# Patient Record
Sex: Female | Born: 1995 | Race: Black or African American | Hispanic: No | Marital: Single | Smoking: Former smoker
Health system: Southern US, Community
[De-identification: ages and names within clinical notes are randomized; demographics above are authoritative.]

## PROBLEM LIST (undated history)

## (undated) DIAGNOSIS — A5900 Urogenital trichomoniasis, unspecified: Secondary | ICD-10-CM

## (undated) DIAGNOSIS — A549 Gonococcal infection, unspecified: Secondary | ICD-10-CM

## (undated) DIAGNOSIS — A749 Chlamydial infection, unspecified: Secondary | ICD-10-CM

## (undated) DIAGNOSIS — L309 Dermatitis, unspecified: Secondary | ICD-10-CM

## (undated) HISTORY — DX: Urogenital trichomoniasis, unspecified: A59.00

## (undated) HISTORY — DX: Gonococcal infection, unspecified: A54.9

## (undated) HISTORY — DX: Chlamydial infection, unspecified: A74.9

## (undated) HISTORY — PX: NO PAST SURGERIES: SHX2092

## (undated) HISTORY — DX: Dermatitis, unspecified: L30.9

---

## 2017-07-17 DIAGNOSIS — O4700 False labor before 37 completed weeks of gestation, unspecified trimester: Secondary | ICD-10-CM | POA: Insufficient documentation

## 2017-08-06 DIAGNOSIS — O9A219 Injury, poisoning and certain other consequences of external causes complicating pregnancy, unspecified trimester: Secondary | ICD-10-CM | POA: Insufficient documentation

## 2018-12-17 NOTE — L&D Delivery Note (Addendum)
OB/[facility]N Faculty Practice Delivery Note  Susan Rivera is a 23 y.o. G2P1001 s/p SVD at [redacted]w[redacted]d. She was admitted for IOL due to postdates.   ROM: SROM 11h 81m with clear fluid GBS Status:  Negative/-- (11/17 1214) Maximum Maternal Temperature: 98.3   Labor Progress: . Patient arrived at 3 cm dilation and was induced with Cytotec then pitocin.   Delivery Date/Time: 12/04/2019 at 2100 Delivery: Entered room and patient was pushing effectively. Head delivered in ROA position. No nuchal cord present. Shoulder and body delivered in usual fashion. Infant with spontaneous cry, placed on mother's abdomen, dried and stimulated. Cord clamped x 2 after 1-minute delay, and cut by grandmother of the baby. Cord blood drawn. Placenta delivered spontaneously with gentle cord traction. Fundus firm with massage and Pitocin. Labia, perineum, vagina, and cervix inspected with no laceration found.   Placenta: spontaneous, intact, 3 vessel cord Complications:  none Lacerations: none EBL: 50cc Analgesia: epidural   Infant: APGAR (1 MIN): 8   APGAR (5 MINS): 9    Weight: pending 1 hr Skin to skin  Demetrius Revel, MD PGY-3  Midwife attestation: I was gloved and present for delivery in its entirety and I agree with the above resident's note.  Lajean Manes, CNM 9:32 PM

## 2019-04-27 DIAGNOSIS — O98211 Gonorrhea complicating pregnancy, first trimester: Secondary | ICD-10-CM | POA: Insufficient documentation

## 2019-07-07 LAB — OB RESULTS CONSOLE GC/CHLAMYDIA
Chlamydia: NEGATIVE
Gonorrhea: NEGATIVE

## 2019-10-27 ENCOUNTER — Telehealth: Payer: Self-pay | Admitting: General Practice

## 2019-10-27 NOTE — Telephone Encounter (Signed)
Patient called and left message on nurse voicemail line stating she thinks she might be in early labor. She states she is seeing a pale yellow watery discharge and her braxton hicks contractions have become more intense and she isn't sure what to do.  Called patient and asked where she has been receiving her prenatal care and she states she hasn't been seen yet but is around 35 weeks. Patient reports new OB appt on 11/17. Advised patient she go to [facility] for evaluation and provided directions. Patient verbalized understanding & had no questions.

## 2019-10-28 ENCOUNTER — Encounter (HOSPITAL_COMMUNITY): Payer: Self-pay | Admitting: *Deleted

## 2019-10-28 ENCOUNTER — Other Ambulatory Visit: Payer: Self-pay

## 2019-10-28 ENCOUNTER — Inpatient Hospital Stay (HOSPITAL_COMMUNITY)
Admission: AD | Admit: 2019-10-28 | Discharge: 2019-10-28 | Disposition: A | Discharge status: Home / Self Care | Payer: BC Managed Care – PPO | Attending: Obstetrics and Gynecology | Admitting: Obstetrics and Gynecology

## 2019-10-28 DIAGNOSIS — Z87891 Personal history of nicotine dependence: Secondary | ICD-10-CM | POA: Diagnosis not present

## 2019-10-28 DIAGNOSIS — O26893 Other specified pregnancy related conditions, third trimester: Secondary | ICD-10-CM | POA: Diagnosis not present

## 2019-10-28 DIAGNOSIS — Z3689 Encounter for other specified antenatal screening: Secondary | ICD-10-CM

## 2019-10-28 DIAGNOSIS — Z0371 Encounter for suspected problem with amniotic cavity and membrane ruled out: Secondary | ICD-10-CM

## 2019-10-28 DIAGNOSIS — Z3A35 35 weeks gestation of pregnancy: Secondary | ICD-10-CM | POA: Diagnosis not present

## 2019-10-28 LAB — WET PREP, GENITAL
Clue Cells Wet Prep HPF POC: NONE SEEN
Sperm: NONE SEEN
Trich, Wet Prep: NONE SEEN
Yeast Wet Prep HPF POC: NONE SEEN

## 2019-10-28 LAB — URINALYSIS, ROUTINE W REFLEX MICROSCOPIC
Bilirubin Urine: NEGATIVE
Glucose, UA: NEGATIVE mg/dL
Hgb urine dipstick: NEGATIVE
Ketones, ur: NEGATIVE mg/dL
Leukocytes,Ua: NEGATIVE
Nitrite: NEGATIVE
Protein, ur: NEGATIVE mg/dL
Specific Gravity, Urine: 1.015 (ref 1.005–1.030)
pH: 7 (ref 5.0–8.0)

## 2019-10-28 LAB — AMNISURE RUPTURE OF MEMBRANE (ROM) NOT AT ARMC: Amnisure ROM: NEGATIVE

## 2019-10-28 LAB — POCT FERN TEST: POCT Fern Test: NEGATIVE

## 2019-10-28 NOTE — Discharge Instructions (Signed)
Safe Medications in Pregnancy    Acne: Benzoyl Peroxide Salicylic Acid  Backache/Headache: Tylenol: 2 regular strength every 4 hours OR              2 Extra strength every 6 hours  Colds/Coughs/Allergies: Benadryl (alcohol free) 25 mg every 6 hours as needed Breath right strips Claritin Cepacol throat lozenges Chloraseptic throat spray Cold-Eeze- up to three times per day Cough drops, alcohol free Flonase (by prescription only) Guaifenesin Mucinex Robitussin DM (plain only, alcohol free) Saline nasal spray/drops Sudafed (pseudoephedrine) & Actifed ** use only after [redacted] weeks gestation and if you do not have high blood pressure Tylenol Vicks Vaporub Zinc lozenges Zyrtec   Constipation: Colace Ducolax suppositories Fleet enema Glycerin suppositories Metamucil Milk of magnesia Miralax Senokot Smooth move tea  Diarrhea: Kaopectate Imodium A-D  *NO pepto Bismol  Hemorrhoids: Anusol Anusol HC Preparation H Tucks  Indigestion: Tums Maalox Mylanta Zantac  Pepcid  Insomnia: Benadryl (alcohol free)  every 6 hours as needed Tylenol PM Unisom, no Gelcaps  Leg Cramps: Tums MagGel  Nausea/Vomiting:  Bonine Dramamine Emetrol Ginger extract Sea bands Meclizine  Nausea medication to take during pregnancy:  Unisom (doxylamine succinate 25 mg tablets) Take one tablet daily at bedtime. If symptoms are not adequately controlled, the dose can be increased to a maximum recommended dose of two tablets daily (1/2 tablet in the morning, 1/2 tablet mid-afternoon and one at bedtime). Vitamin B6  tablets. Take one tablet twice a day (up to 200 mg per day).  Skin Rashes: Aveeno products Benadryl cream or  every 6 hours as needed Calamine Lotion 1% cortisone cream  Yeast infection: Gyne-lotrimin 7 Monistat 7   **If taking multiple medications, please check labels to avoid duplicating the same active ingredients **take  medication as directed on the label ** Do not exceed 4000 mg of tylenol in 24 hours **Do not take medications that contain aspirin or ibuprofen    Third Trimester of Pregnancy The third trimester is from week 28 through week 40 (months 7 through 9). The third trimester is a time when the unborn baby (fetus) is growing rapidly. At the end of the ninth month, the fetus is about 20 inches in length and weighs 6-10 pounds. Body changes during your third trimester Your body will continue to go through many changes during pregnancy. The changes vary from woman to woman. During the third trimester:  Your weight will continue to increase. You can expect to gain 25-35 pounds (11-16 kg) by the end of the pregnancy.  You may begin to get stretch marks on your hips, abdomen, and breasts.  You may urinate more often because the fetus is moving lower into your pelvis and pressing on your bladder.  You may develop or continue to have heartburn. This is caused by increased hormones that slow down muscles in the digestive tract.  You may develop or continue to have constipation because increased hormones slow digestion and cause the muscles that push waste through your intestines to relax.  You may develop hemorrhoids. These are swollen veins (varicose veins) in the rectum that can itch or be painful.  You may develop swollen, bulging veins (varicose veins) in your legs.  You may have increased body aches in the pelvis, back, or thighs. This is due to weight gain and increased hormones that are relaxing your joints.  You may have changes in your hair. These can include thickening of your hair, rapid growth, and changes in texture. Some women also have  hair loss during or after pregnancy, or hair that feels dry or thin. Your hair will most likely return to normal after your baby is born.  Your breasts will continue to grow and they will continue to become tender. A yellow fluid (colostrum) may leak from  your breasts. This is the first milk you are producing for your baby.  Your belly button may stick out.  You may notice more swelling in your hands, face, or ankles.  You may have increased tingling or numbness in your hands, arms, and legs. The skin on your belly may also feel numb.  You may feel short of breath because of your expanding uterus.  You may have more problems sleeping. This can be caused by the size of your belly, increased need to urinate, and an increase in your body's metabolism.  You may notice the fetus "dropping," or moving lower in your abdomen (lightening).  You may have increased vaginal discharge.  You may notice your joints feel loose and you may have pain around your pelvic bone. What to expect at prenatal visits You will have prenatal exams every 2 weeks until week 36. Then you will have weekly prenatal exams. During a routine prenatal visit:  You will be weighed to make sure you and the baby are growing normally.  Your blood pressure will be taken.  Your abdomen will be measured to track your baby's growth.  The fetal heartbeat will be listened to.  Any test results from the previous visit will be discussed.  You may have a cervical check near your due date to see if your cervix has softened or thinned (effaced).  You will be tested for Group B streptococcus. This happens between 35 and 37 weeks. Your health care provider may ask you:  What your birth plan is.  How you are feeling.  If you are feeling the baby move.  If you have had any abnormal symptoms, such as leaking fluid, bleeding, severe headaches, or abdominal cramping.  If you are using any tobacco products, including cigarettes, chewing tobacco, and electronic cigarettes.  If you have any questions. Other tests or screenings that may be performed during your third trimester include:  Blood tests that check for low iron levels (anemia).  Fetal testing to check the health,  activity level, and growth of the fetus. Testing is done if you have certain medical conditions or if there are problems during the pregnancy.  Nonstress test (NST). This test checks the health of your baby to make sure there are no signs of problems, such as the baby not getting enough oxygen. During this test, a belt is placed around your belly. The baby is made to move, and its heart rate is monitored during movement. What is false labor? False labor is a condition in which you feel small, irregular tightenings of the muscles in the womb (contractions) that usually go away with rest, changing position, or drinking water. These are called Braxton Hicks contractions. Contractions may last for hours, days, or even weeks before true labor sets in. If contractions come at regular intervals, become more frequent, increase in intensity, or become painful, you should see your health care provider. What are the signs of labor?  Abdominal cramps.  Regular contractions that start at 10 minutes apart and become stronger and more frequent with time.  Contractions that start on the top of the uterus and spread down to the lower abdomen and back.  Increased pelvic pressure and dull back  pain.  A watery or bloody mucus discharge that comes from the vagina.  Leaking of amniotic fluid. This is also known as your "water breaking." It could be a slow trickle or a gush. Let your health care provider know if it has a color or strange odor. If you have any of these signs, call your health care provider right away, even if it is before your due date. Follow these instructions at home: Medicines  Follow your health care provider's instructions regarding medicine use. Specific medicines may be either safe or unsafe to take during pregnancy.  Take a prenatal vitamin that contains at least 600 micrograms (mcg) of folic acid.  If you develop constipation, try taking a stool softener if your health care provider  approves. Eating and drinking   Eat a balanced diet that includes fresh fruits and vegetables, whole grains, good sources of protein such as meat, eggs, or tofu, and low-fat dairy. Your health care provider will help you determine the amount of weight gain that is right for you.  Avoid raw meat and uncooked cheese. These carry germs that can cause birth defects in the baby.  If you have low calcium intake from food, talk to your health care provider about whether you should take a daily calcium supplement.  Eat four or five small meals rather than three large meals a day.  Limit foods that are high in fat and processed sugars, such as fried and sweet foods.  To prevent constipation: ? Drink enough fluid to keep your urine clear or pale yellow. ? Eat foods that are high in fiber, such as fresh fruits and vegetables, whole grains, and beans. Activity  Exercise only as directed by your health care provider. Most women can continue their usual exercise routine during pregnancy. Try to exercise for 30 minutes at least 5 days a week. Stop exercising if you experience uterine contractions.  Avoid heavy lifting.  Do not exercise in extreme heat or humidity, or at high altitudes.  Wear low-heel, comfortable shoes.  Practice good posture.  You may continue to have sex unless your health care provider tells you otherwise. Relieving pain and discomfort  Take frequent breaks and rest with your legs elevated if you have leg cramps or low back pain.  Take warm sitz baths to soothe any pain or discomfort caused by hemorrhoids. Use hemorrhoid cream if your health care provider approves.  Wear a good support bra to prevent discomfort from breast tenderness.  If you develop varicose veins: ? Wear support pantyhose or compression stockings as told by your healthcare provider. ? Elevate your feet for 15 minutes, 3-4 times a day. Prenatal care  Write down your questions. Take them to your  prenatal visits.  Keep all your prenatal visits as told by your health care provider. This is important. Safety  Wear your seat belt at all times when driving.  Make a list of emergency phone numbers, including numbers for family, friends, the hospital, and police and fire departments. General instructions  Avoid cat litter boxes and soil used by cats. These carry germs that can cause birth defects in the baby. If you have a cat, ask someone to clean the litter box for you.  Do not travel far distances unless it is absolutely necessary and only with the approval of your health care provider.  Do not use hot tubs, steam rooms, or saunas.  Do not drink alcohol.  Do not use any products that contain nicotine or tobacco,  such as cigarettes and e-cigarettes. If you need help quitting, ask your health care provider.  Do not use any medicinal herbs or unprescribed drugs. These chemicals affect the formation and growth of the baby.  Do not douche or use tampons or scented sanitary pads.  Do not cross your legs for long periods of time.  To prepare for the arrival of your baby: ? Take prenatal classes to understand, practice, and ask questions about labor and delivery. ? Make a trial run to the hospital. ? Visit the hospital and tour the maternity area. ? Arrange for maternity or paternity leave through employers. ? Arrange for family and friends to take care of pets while you are in the hospital. ? Purchase a rear-facing car seat and make sure you know how to install it in your car. ? Pack your hospital bag. ? Prepare the babys nursery. Make sure to remove all pillows and stuffed animals from the baby's crib to prevent suffocation.  Visit your dentist if you have not gone during your pregnancy. Use a soft toothbrush to brush your teeth and be gentle when you floss. Contact a health care provider if:  You are unsure if you are in labor or if your water has broken.  You become  dizzy.  You have mild pelvic cramps, pelvic pressure, or nagging pain in your abdominal area.  You have lower back pain.  You have persistent nausea, vomiting, or diarrhea.  You have an unusual or bad smelling vaginal discharge.  You have pain when you urinate. Get help right away if:  Your water breaks before 37 weeks.  You have regular contractions less than 5 minutes apart before 37 weeks.  You have a fever.  You are leaking fluid from your vagina.  You have spotting or bleeding from your vagina.  You have severe abdominal pain or cramping.  You have rapid weight loss or weight gain.  You have shortness of breath with chest pain.  You notice sudden or extreme swelling of your face, hands, ankles, feet, or legs.  Your baby makes fewer than 10 movements in 2 hours.  You have severe headaches that do not go away when you take medicine.  You have vision changes. Summary  The third trimester is from week 28 through week 40, months 7 through 9. The third trimester is a time when the unborn baby (fetus) is growing rapidly.  During the third trimester, your discomfort may increase as you and your baby continue to gain weight. You may have abdominal, leg, and back pain, sleeping problems, and an increased need to urinate.  During the third trimester your breasts will keep growing and they will continue to become tender. A yellow fluid (colostrum) may leak from your breasts. This is the first milk you are producing for your baby.  False labor is a condition in which you feel small, irregular tightenings of the muscles in the womb (contractions) that eventually go away. These are called Braxton Hicks contractions. Contractions may last for hours, days, or even weeks before true labor sets in.  Signs of labor can include: abdominal cramps; regular contractions that start at 10 minutes apart and become stronger and more frequent with time; watery or bloody mucus discharge that  comes from the vagina; increased pelvic pressure and dull back pain; and leaking of amniotic fluid. This information is not intended to replace advice given to you by your health care provider. Make sure you discuss any questions you have with  your health care provider. Document Released: 11/27/2001 Document Revised: 03/26/2019 Document Reviewed: 01/08/2017 Elsevier Patient Education  2020 [employer].    Signs and Symptoms of Labor Labor is your body's natural process of moving your baby, placenta, and umbilical cord out of your uterus. The process of labor usually starts when your baby is full-term, between 68 and 40 weeks of pregnancy. How will I know when I am close to going into labor? As your body prepares for labor and the birth of your baby, you may notice the following symptoms in the weeks and days before true labor starts:  Having a strong desire to get your home ready to receive your new baby. This is called nesting. Nesting may be a sign that labor is approaching, and it may occur several weeks before birth. Nesting may involve cleaning and organizing your home.  Passing a small amount of thick, bloody mucus out of your vagina (normal bloody show or losing your mucus plug). This may happen more than a week before labor begins, or it might occur right before labor begins as the opening of the cervix starts to widen (dilate). For some women, the entire mucus plug passes at once. For others, smaller portions of the mucus plug may gradually pass over several days.  Your baby moving (dropping) lower in your pelvis to get into position for birth (lightening). When this happens, you may feel more pressure on your bladder and pelvic bone and less pressure on your ribs. This may make it easier to breathe. It may also cause you to need to urinate more often and have problems with bowel movements.  Having "practice contractions" (Braxton Hicks contractions) that occur at irregular (unevenly  spaced) intervals that are more than 10 minutes apart. This is also called false labor. False labor contractions are common after exercise or sexual activity, and they will stop if you change position, rest, or drink fluids. These contractions are usually mild and do not get stronger over time. They may feel like: ? A backache or back pain. ? Mild cramps, similar to menstrual cramps. ? Tightening or pressure in your abdomen. Other early symptoms that labor may be starting soon include:  Nausea or loss of appetite.  Diarrhea.  Having a sudden burst of energy, or feeling very tired.  Mood changes.  Having trouble sleeping. How will I know when labor has begun? Signs that true labor has begun may include:  Having contractions that come at regular (evenly spaced) intervals and increase in intensity. This may feel like more intense tightening or pressure in your abdomen that moves to your back. ? Contractions may also feel like rhythmic pain in your upper thighs or back that comes and goes at regular intervals. ? For first-time mothers, this change in intensity of contractions often occurs at a more gradual pace. ? Women who have given birth before may notice a more rapid progression of contraction changes.  Having a feeling of pressure in the vaginal area.  Your water breaking (rupture of membranes). This is when the sac of fluid that surrounds your baby breaks. When this happens, you will notice fluid leaking from your vagina. This may be clear or blood-tinged. Labor usually starts within 24 hours of your water breaking, but it may take longer to begin. ? Some women notice this as a gush of fluid. ? Others notice that their underwear repeatedly becomes damp. Follow these instructions at home:   When labor starts, or if your water breaks,  call your health care provider or nurse care line. Based on your situation, they will determine when you should go in for an exam.  When you are in  early labor, you may be able to rest and manage symptoms at home. Some strategies to try at home include: ? Breathing and relaxation techniques. ? Taking a warm bath or shower. ? Listening to music. ? Using a heating pad on the lower back for pain. If you are directed to use heat:  Place a towel between your skin and the heat source.  Leave the heat on for 20-30 minutes.  Remove the heat if your skin turns bright red. This is especially important if you are unable to feel pain, heat, or cold. You may have a greater risk of getting burned. Get help right away if:  You have painful, regular contractions that are 5 minutes apart or less.  Labor starts before you are [redacted] weeks along in your pregnancy.  You have a fever.  You have a headache that does not go away.  You have bright red blood coming from your vagina.  You do not feel your baby moving.  You have a sudden onset of: ? Severe headache with vision problems. ? Nausea, vomiting, or diarrhea. ? Chest pain or shortness of breath. These symptoms may be an emergency. If your health care provider recommends that you go to the hospital or birth center where you plan to deliver, do not drive yourself. Have someone else drive you, or call emergency services (911 in the [place] Summary  Labor is your body's natural process of moving your baby, placenta, and umbilical cord out of your uterus.  The process of labor usually starts when your baby is full-term, between 38 and 40 weeks of pregnancy.  When labor starts, or if your water breaks, call your health care provider or nurse care line. Based on your situation, they will determine when you should go in for an exam. This information is not intended to replace advice given to you by your health care provider. Make sure you discuss any questions you have with your health care provider. Document Released: 05/10/2017 Document Revised: 09/02/2017 Document Reviewed: 05/10/2017 Elsevier  Patient Education  2020 [employer].

## 2019-10-28 NOTE — MAU Provider Note (Signed)
History     CSN: [id number]  Arrival date and time: 10/28/19 1151   First Provider Initiated Contact with Patient 10/28/19 1245      Chief Complaint  Patient presents with  . Contractions  . Rupture of Membranes   [name] is a 23 y.o. G2P1001 at [redacted]w[redacted]d who presents to MAU for PTL evaluation after she had "light yellow watery discharge" that began last week. Pt reports "intense Braxton-Hicks contractions" that have been going on for about a week, but are not present today. Pt reports the contractions are usually more present at night time. Pt reports "a lot of pelvic pressure that goes down into the vagina." Pt stating today that her mother suggested she come in to be checked to be sure everything is OK as her last pregnancy was high-risk.  Pt denies VB, new onset backache, intermittent abdominal discomfort/pain, pelvic pain, cramping. Pt denies chest pain and SOB.  Pt denies constipation, diarrhea, or urinary problems. Pt denies fever, chills, fatigue, sweating or changes in appetite. Pt denies dizziness, light-headedness, weakness.  Pt denies VB and reports good FM.  Current pregnancy problems? none this pregnancy, last pregnancy had polyhydramnios Blood Type? unknown Allergies? NKDA Current medications? PNVs Current PNC & next appt? [facility] 11/03/2019 for transfer of care, pt was getting regular PNC in [place]   OB History    Gravida  2   Para  1   Term  1   Preterm      AB      Living  1     SAB      TAB      Ectopic      Multiple      Live Births              Past Medical History:  Diagnosis Date  . Medical history non-contributory     Past Surgical History:  Procedure Laterality Date  . NO PAST SURGERIES      Family History  Problem Relation Age of Onset  . Diabetes Maternal Uncle   . Diabetes Maternal Grandfather   . Heart disease Maternal Grandfather     Social History   Tobacco Use  . Smoking status: Former Smoker    Quit  date: 02/15/2019    Years since quitting: 0.6  . Smokeless tobacco: Never Used  Substance Use Topics  . Alcohol use: Not Currently    Comment: none since + UPT  . Drug use: Never    Allergies: No Known Allergies  No medications prior to admission.    Review of Systems  Constitutional: Negative for chills, diaphoresis, fatigue and fever.  Eyes: Negative for visual disturbance.  Respiratory: Negative for shortness of breath.   Cardiovascular: Negative for chest pain.  Gastrointestinal: Negative for abdominal pain, constipation, diarrhea, nausea and vomiting.  Genitourinary: Positive for vaginal discharge. Negative for dysuria, flank pain, frequency, pelvic pain, urgency and vaginal bleeding.  Neurological: Negative for dizziness, weakness, light-headedness and headaches.   Physical Exam   Blood pressure 116/65, pulse 91, temperature 98.1 F (36.7 C), temperature source Oral, resp. rate 20, height 5\' 1"  (1.549 m), weight 67.3 kg, SpO2 100 %.  Patient Vitals for the past 24 hrs:  BP Temp Temp src Pulse Resp SpO2 Height Weight  10/28/19 1214 116/65 98.1 F (36.7 C) Oral 91 20 100 % - -  10/28/19 1205 - - - - - - 5\' 1"  (1.549 m) 67.3 kg   Physical Exam  Constitutional: She is  oriented to person, place, and time. She appears well-developed and well-nourished. No distress.  HENT:  Head: Normocephalic and atraumatic.  Respiratory: Effort normal.  GI: Soft. She exhibits no distension and no mass. There is no abdominal tenderness. There is no rebound and no guarding.  Genitourinary: There is no rash, tenderness or lesion on the right labia. There is no rash, tenderness or lesion on the left labia. Uterus is enlarged. Uterus is not tender. Cervix exhibits no motion tenderness, no discharge and no friability.    No vaginal discharge, tenderness or bleeding.  No tenderness or bleeding in the vagina.  Neurological: She is alert and oriented to person, place, and time.  Skin: Skin is warm  and dry. She is not diaphoretic.  Psychiatric: She has a normal mood and affect. Her behavior is normal. Judgment and thought content normal.   Results for orders placed or performed during the hospital encounter of 10/28/19 (from the past 24 hour(s))  Urinalysis, Routine w reflex microscopic     Status: None   Collection Time: 10/28/19 12:53 PM  Result Value Ref Range   Color, Urine YELLOW YELLOW   APPearance CLEAR CLEAR   Specific Gravity, Urine 1.015 1.005 - 1.030   pH 7.0 5.0 - 8.0   Glucose, UA NEGATIVE NEGATIVE mg/dL   Hgb urine dipstick NEGATIVE NEGATIVE   Bilirubin Urine NEGATIVE NEGATIVE   Ketones, ur NEGATIVE NEGATIVE mg/dL   Protein, ur NEGATIVE NEGATIVE mg/dL   Nitrite NEGATIVE NEGATIVE   Leukocytes,Ua NEGATIVE NEGATIVE  Wet prep, genital     Status: Abnormal   Collection Time: 10/28/19  1:10 PM  Result Value Ref Range   Yeast Wet Prep HPF POC NONE SEEN NONE SEEN   Trich, Wet Prep NONE SEEN NONE SEEN   Clue Cells Wet Prep HPF POC NONE SEEN NONE SEEN   WBC, Wet Prep HPF POC MODERATE (A) NONE SEEN   Sperm NONE SEEN   POCT fern test     Status: None   Collection Time: 10/28/19  1:34 PM  Result Value Ref Range   POCT Fern Test Negative = intact amniotic membranes   [facility]     Status: None   Collection Time: 10/28/19  1:34 PM  Result Value Ref Range   Amnisure ROM NEGATIVE    MAU Course  Procedures  MDM -r/o PPROM -no pooling on SSE, scant white discharge present -UA: WNL -Fern: negative -AmniSure: negative -WetPrep: WNL -GC/CT collected -EFM: reactive       -baseline: 125       -variability: moderate       -accels: present, 15x15       -decels: absent       -TOCO: irritability -pt discharged to home in stable condition  Orders Placed This Encounter  Procedures  . Wet prep, genital    Standing Status:   Standing    Number of Occurrences:   1  . Urinalysis, Routine w reflex microscopic    Standing Status:    Standing    Number of Occurrences:   1  . Amnisure rupture [facility] membrane (rom[facility]not at [facility]    Standing Status:   Standing    Number of Occurrences:   1  . POCT fern test    Standing Status:   Standing    Number of Occurrences:   1  . Discharge patient    Order Specific Question:   Discharge disposition    Answer:   01-Home  or Self Care [1]    Order Specific Question:   Discharge patient date    Answer:   10/28/2019   Assessment and Plan   1. Encounter for suspected premature rupture of amniotic membranes, with rupture of membranes not found   2. [redacted] weeks gestation of pregnancy   3. NST (non-stress test) reactive    Allergies as of 10/28/2019   No Known Allergies     Medication List    You have not been prescribed any medications.    -pt has OB appt @ [facility] 11/03/2019 to transfer care from [place], pt strongly advised to keep appt -discussed s/sx of labor/ROM -MAU return precautions given -pt discharged to home in stable condition  Elmyra Ricks E Nugent 10/28/2019, 2:26 PM

## 2019-10-28 NOTE — MAU Note (Signed)
Present with c/o worsening [address] and an increase in vaginal discharge, states discharge is watery.  Denies VB.  Endorses +FM. Pt reports no PNC since August secondary moved to [facility].  Received care in [facility]

## 2019-10-29 LAB — GC/CHLAMYDIA PROBE AMP (~~LOC~~) NOT AT ARMC
Chlamydia: NEGATIVE
Comment: NEGATIVE
Comment: NORMAL
Neisseria Gonorrhea: NEGATIVE

## 2019-11-02 ENCOUNTER — Encounter: Payer: Self-pay | Admitting: *Deleted

## 2019-11-02 ENCOUNTER — Telehealth: Payer: Self-pay | Admitting: Obstetrics and Gynecology

## 2019-11-02 DIAGNOSIS — A749 Chlamydial infection, unspecified: Secondary | ICD-10-CM

## 2019-11-02 DIAGNOSIS — A549 Gonococcal infection, unspecified: Secondary | ICD-10-CM | POA: Insufficient documentation

## 2019-11-02 NOTE — Telephone Encounter (Signed)
Attempted to call patient about her appointment on 11/17 @ 8:35. No answer left voicemail instructing patient to wear a face mask for the entire appointment and no visitors are allowed during the visit. Patient instructed not to attend the appointment if she was any symptoms. Symptom list and office number left.  °

## 2019-11-03 ENCOUNTER — Other Ambulatory Visit: Payer: Self-pay

## 2019-11-03 ENCOUNTER — Encounter: Payer: Self-pay | Admitting: Obstetrics and Gynecology

## 2019-11-03 ENCOUNTER — Ambulatory Visit (INDEPENDENT_AMBULATORY_CARE_PROVIDER_SITE_OTHER): Payer: BC Managed Care – PPO | Admitting: Obstetrics and Gynecology

## 2019-11-03 DIAGNOSIS — Z23 Encounter for immunization: Secondary | ICD-10-CM | POA: Diagnosis not present

## 2019-11-03 DIAGNOSIS — Z3480 Encounter for supervision of other normal pregnancy, unspecified trimester: Secondary | ICD-10-CM | POA: Insufficient documentation

## 2019-11-03 DIAGNOSIS — Z3483 Encounter for supervision of other normal pregnancy, third trimester: Secondary | ICD-10-CM

## 2019-11-03 DIAGNOSIS — Z3A36 36 weeks gestation of pregnancy: Secondary | ICD-10-CM

## 2019-11-03 NOTE — Progress Notes (Signed)
Prenatal Visit Note Date: 11/03/2019 Clinic: [facility]  Transfer of care visit from [place], [place] [facility] care everywhere)  Subjective:  Susan Rivera is a 24 y.o. G2P1001 at [redacted]w[redacted]d (8wk u/s) being seen today for ongoing prenatal care.  She is currently monitored for the following issues for this low-risk pregnancy and has Gonorrhea and Chlamydia on their problem list.  Patient reports no complaints.   Contractions: Irritability. Vag. Bleeding: None.  Movement: Present. Denies leaking of fluid.   The following portions of the patient's history were reviewed and updated as appropriate: allergies, current medications, past family history, past medical history, past social history, past surgical history and problem list. Problem list updated.  Objective:   Vitals:   11/03/19 0841  BP: 99/62  Pulse: 94  Weight: 149 lb 12.8 oz (67.9 kg)    Fetal Status: Fetal Heart Rate (bpm): 138 Fundal Height: 34 cm Movement: Present  Presentation: Vertex  General:  Alert, oriented and cooperative. Patient is in no acute distress.  Skin: Skin is warm and dry. No rash noted.   Cardiovascular: Normal heart rate noted  Respiratory: Normal respiratory effort, no problems with respiration noted  Abdomen: Soft, gravid, appropriate for gestational age. Pain/Pressure: Present     Pelvic:  Cervical exam performed Dilation: Fingertip Effacement (%): 0 Station: Ballotable  Extremities: Normal range of motion.  Edema: Trace  Mental Status: Normal mood and affect. Normal behavior. Normal judgment and thought content.   Urinalysis:      Assessment and Plan:  Pregnancy: G2P1001 at [redacted]w[redacted]d  1. Supervision of other normal pregnancy, antepartum Routine care. Last visit at 25wks in mid to late august. 28wk labs today. Oriented to practice. Ask more about bc nv. Will request anatomy u/s results.  - GTT, 2 Hours w/1 Hour *LC - CBC - HIV antibody (with reflex) - RPR -  GBS*LC  Preterm labor symptoms and general obstetric precautions including but not limited to vaginal bleeding, contractions, leaking of fluid and fetal movement were reviewed in detail with the patient. Please refer to After Visit Summary for other counseling recommendations.  Return in about 1 week (around 11/10/2019).   Aletha Halim, MD

## 2019-11-04 LAB — CBC
Hematocrit: 29.5 % — ABNORMAL LOW (ref 34.0–46.6)
Hemoglobin: 9.4 g/dL — ABNORMAL LOW (ref 11.1–15.9)
MCH: 24.6 pg — ABNORMAL LOW (ref 26.6–33.0)
MCHC: 31.9 g/dL (ref 31.5–35.7)
MCV: 77 fL — ABNORMAL LOW (ref 79–97)
Platelets: 215 10*3/uL (ref 150–450)
RBC: 3.82 x10E6/uL (ref 3.77–5.28)
RDW: 15.6 % — ABNORMAL HIGH (ref 11.7–15.4)
WBC: 12.1 10*3/uL — ABNORMAL HIGH (ref 3.4–10.8)

## 2019-11-04 LAB — GLUCOSE TOLERANCE, 2 HOURS W/ 1HR
Glucose, 1 hour: 144 mg/dL (ref 65–179)
Glucose, 2 hour: 110 mg/dL (ref 65–152)
Glucose, Fasting: 71 mg/dL (ref 65–91)

## 2019-11-04 LAB — RPR: RPR Ser Ql: NONREACTIVE

## 2019-11-04 LAB — HIV ANTIBODY (ROUTINE TESTING W REFLEX): HIV Screen 4th Generation wRfx: NONREACTIVE

## 2019-11-05 ENCOUNTER — Encounter: Payer: Self-pay | Admitting: Obstetrics and Gynecology

## 2019-11-05 ENCOUNTER — Telehealth (INDEPENDENT_AMBULATORY_CARE_PROVIDER_SITE_OTHER): Payer: BC Managed Care – PPO | Admitting: General Practice

## 2019-11-05 DIAGNOSIS — O99019 Anemia complicating pregnancy, unspecified trimester: Secondary | ICD-10-CM | POA: Insufficient documentation

## 2019-11-05 DIAGNOSIS — O99013 Anemia complicating pregnancy, third trimester: Secondary | ICD-10-CM

## 2019-11-05 MED ORDER — FERROUS GLUCONATE 324 (38 FE) MG PO TABS: 324.0000 mg | ORAL_TABLET | Freq: Every day | ORAL | 0 refills | Status: AC

## 2019-11-05 MED ORDER — DOCUSATE SODIUM 100 MG PO CAPS: 100.0000 mg | ORAL_CAPSULE | Freq: Two times a day (BID) | ORAL | 1 refills | Status: AC | PRN

## 2019-11-05 NOTE — Telephone Encounter (Signed)
-----   Message from Aletha Halim, MD sent at 11/05/2019  9:00 AM EST ----- Can you let her know that I sent in some qday iron for her? thanks

## 2019-11-05 NOTE — Addendum Note (Signed)
Addended by: Aletha Halim on: 11/05/2019 09:00 AM   Modules accepted: Orders

## 2019-11-05 NOTE — Telephone Encounter (Signed)
Called patient & informed her of results, prescription, prescription instructions, & stool softener Rx as well. Patient verbalized understanding to all & had no questions.

## 2019-11-07 LAB — CULTURE, BETA STREP (GROUP B ONLY): Strep Gp B Culture: NEGATIVE

## 2019-11-10 ENCOUNTER — Ambulatory Visit (INDEPENDENT_AMBULATORY_CARE_PROVIDER_SITE_OTHER): Payer: BC Managed Care – PPO | Admitting: Medical

## 2019-11-10 ENCOUNTER — Encounter: Payer: Self-pay | Admitting: Medical

## 2019-11-10 ENCOUNTER — Other Ambulatory Visit: Payer: Self-pay

## 2019-11-10 DIAGNOSIS — Z3A37 37 weeks gestation of pregnancy: Secondary | ICD-10-CM

## 2019-11-10 DIAGNOSIS — Z3483 Encounter for supervision of other normal pregnancy, third trimester: Secondary | ICD-10-CM

## 2019-11-10 DIAGNOSIS — Z3480 Encounter for supervision of other normal pregnancy, unspecified trimester: Secondary | ICD-10-CM

## 2019-11-10 NOTE — Patient Instructions (Signed)
AREA PEDIATRIC/FAMILY PRACTICE PHYSICIANS  [place] ([address]) . [facility] o Chambliss, MD; Eniola, MD; Hale, MD; Hensel, MD; McDiarmid, MD; McIntyer, MD; Neal, MD; Walden, MD o [address] [place], [place] [address] o [phone] o Mon-Fri 8:30-12:30, 1:30-5:00 o Providers come to see babies at [facility] o Accepting Medicaid . [facility] o Limited providers who accept newborns: Koirala, MD; Morrow, MD; Wolters, MD o [address], [place], [place] [address] o [phone] o Mon-Fri 8:00-5:30 o Babies seen by providers at [facility] o Does NOT accept Medicaid o Please call early in hospitalization for appointment (limited availability)  . [facility] o Mulberry, MD o [address] [place], [place] [address] o [phone] o Mon, Tue, Thur, Fri 8:30-5:00, Wed 10:00-7:00 (closed 1-2pm) o Babies seen by [facility] providers o Accepting Medicaid . [facility]in - Pediatrician o Rubin, MD o [address], [place], [place] [address] o [phone] o Mon-Fri 8:30-5:00, Sat 8:30-12:00 o Provider comes to see babies at [facility] o Accepting Medicaid o Must have been referred from current patients or contacted office prior to delivery . [facility] ([facility]) o Brown, MD; Chandler, MD; Ettefagh, MD; Grant, MD; Lester, MD; McCormick, MD; McQueen, MD; Prose, MD; Simha, MD; Stanley, MD; Stryffeler, NP; Tebben, NP o [address], [place], [place] [address] o [phone] o Mon, Tue, Thur, Fri 8:30-5:30, Wed 9:30-5:30, Sat 8:30-12:30 o Babies seen by [facility] providers o Accepting Medicaid o Only accepting infants of first-time parents or siblings of current patients o Hospital discharge coordinator will make follow-up appointment . Jack Amos o [address],  [place], [place]  [address] o [phone]   Fax - [phone] . [facility] o [address], [place], [place]  [address] o Phone - [phone]   Fax - [phone] . Shilpa Gosrani o [address], [place], [place]  [address] o [phone]  [place] ([address]) . [facility] o Bates, MD; [place]ass[place], MD; Cooper, Cox, MD; MD; Davis, MD; Dovico, MD; Ettefaugh, MD; Little, MD; Lowe, MD; Keiffer, MD; Melvin, MD; Sumner, MD; Williams, MD o [address], [place], [place] [address] o [phone] o Mon-Fri 8:30-5:00 (extended evenings Mon-Thur as needed), Sat-Sun 10:00-1:00 o Providers come to see babies at [facility] o Accepting Medicaid for families of first-time babies and families with all children in the household age 3 and under. Must register with office prior to making appointment (M-F only). . [facility] o Henson, NP; Knapp, MD; Lalonde, MD; Tysinger, PA o [address] [place], [place] [address] o [phone] o Mon-Fri 8:00-5:00 o Babies seen by providers at [facility] o Does NOT accept Medicaid/Commercial Insurance Only . [facility] ([facility])  o Artis, MD; Barnes, MD; Bratton, MD; Coccaro, MD; Lockett Gardner, MD; Kramer, MD; Marshall, MD; Netherton, MD; Poleto, MD; Skinner, MD o [address] [place], [place] [address] o [phone] o Mon-Fri 8:30-5:30, Sat (Oct.-Mar.) 9:00-1:00 o Babies seen by providers at [facility] o Accepting Medicaid  [place] ([address]) . [facility] o Reid, MD; Warner, MD o [address], , [place] [address] o [phone] o Mon-Fri 8:30-5:00, Sat 8:30-12:00 o Providers come to see babies at [facility] o Does NOT accept Medicaid . [facility] o Becker, PA; Hagler, MD; Scifres, PA; Sun, MD; Swayne, MD o [address],  [place], [place] [address] o [phone] o Mon-Fri 8:00-5:00 o Babies seen by providers at [facility] o Does NOT accept Medicaid o Only accepting babies of parents who are patients o Please call early in hospitalization for appointment (limited availability) . [facility] o Clark, MD; Frye, MD; Kelleher, MD; Mack, NP; Miller, MD; O'Keller, MD; Patterson, NP; Pudlo, MD; Puzio, MD; Thomas, MD; Tucker, MD; Twiselton, MD o [address], [place], [place] [address] o [phone] o Mon-Fri 8:00-5:00, Sat 9:00-12:00 o Providers come to see babies at [facility] o Does NOT accept Medicaid  [place] ([address]) . [facility] o Limited providers accepting new patients: Brake, NP; Wharton, PA o [address], [place], [place] [address] o [phone] o Mon-Fri 8:00-5:00 o Babies seen by providers at [facility] o Does NOT accept Medicaid o Only accepting babies of parents who are patients o Please call early in hospitalization for appointment (limited availability) . [facility] o Gay, MD; Quinlan, MD o [address] [place], [place] [address] o [phone] (press 1 to schedule appointment) o Mon-Fri 8:00-5:00 o Providers come to see babies at [facility] o Does NOT accept Medicaid . [facility] o Mazer, MD o [address] [place], [place] [address] o [phone] o Mon-Fri 8:30-5:00 (lunch 12:30-1:00), extended hours by appointment only Wed 5:00-6:30 o Babies seen by [facility] providers o Accepting Medicaid . [facility] o Banks, MD; Jordan, MD; Koberlein, MD o [address], [place], [place] [address] o [phone] o Mon-Fri 8:00-5:00 o Babies seen by [facility] providers o Does NOT accept Medicaid . [facility] o Parker, MD; Hunter, MD; Wallace, DO o [address] [place], [place]  [address] o [phone] o Mon-Fri 8:00-5:00 o Babies seen by [facility] providers o Does NOT accept Medicaid . [facility] o Brandon, PA; Brecken, PA; Christy, NP; Dees, MD; DeClaire, MD; DeWeese, MD; Hansen, NP; Mills, NP; Parrish, NP; Smoot, NP; Summer, MD; Vapne, MD o [address] [place], [place] [address] o [phone] o Mon-Fri 8:30-5:00, Sat 10:00-1:00 o Providers come to see babies at [facility] o Does NOT accept Medicaid o Free prenatal information session Tuesdays at 4:45pm . [facility] o Bouska, MD; Gordon, PA; Jeffery, PA; Weber, PA o [address] [place] [address] o [phone] o Mon-Fri 7:30-5:30 o Babies seen by [facility] providers . [facility] o [address], [place], [place]  [address] o [phone]   Fax - [phone]  [place] ([address] & [address]) . [facility] o Reese, MD o [address] [place], [place] [address] o [phone] o Mon-Thur 8:00-6:00 o Providers come to see babies at [facility] o Accepting Medicaid . [facility] o Anderson, NP; Badger, MD; Beal, PA; Spencer, PA o [address] [place], [place] [address] o [phone] o Mon-Thur 7:30-7:30, Fri 7:30-4:30 o Babies seen by [facility] providers o Accepting Medicaid . [facility] o Agbuya, MD; Klett, NP; Romgoolam, MD o [address], [place],  [address] o [phone] o Mon-Fri 8:30-5:00, Sat 8:30-12:00 o Providers come to see babies at [facility] o Accepting Medicaid o Must have "Meet & Greet" appointment at office prior to delivery . [facility] ([facility] ) o McCord,   MD; Juleen China, MD; Clydene Laming, [address], [place], [place] [address] o [phone] o Mon-Wed 8:00-6:00, Thur-Fri 8:00-5:00, Sat 9:00-12:00 o Providers come to  see babies at [facility] o Does NOT accept Medicaid o Only accepting siblings of current patients  [facility]  o [address], [place], [place], [place]  [address] o [phone]   Fax - [phone]  [place] [facility] o [id number] [place], [place]  [address] o [phone]   Fax - [place] [phone] & [phone])  Therapist, music at [place], [place]; [place], [address] [place], [place] [address] o [phone] o Mon-Fri 7:00-5:00 o Babies seen by [facility] providers o Does NOT accept Medicaid  [place], MD; [place], [place]; [place], [place], [place], [place] [address] o [phone] o Mon-Fri 8:00-5:00 o Babies seen by [facility] providers o Accepting Medicaid  [place], MD; [place], [place]; [place], NP; [place], [place], [place], [place] [address] o [phone] o Mon-Fri 8:00-5:00 o Babies seen by providers at [place] [phone])  [facility] [place], [place] o [address] [place], [place] [address] o [phone] o Mon-Fri 8:00-5:00 o Babies seen by [facility] providers o Does NOT accept Medicaid o Limited availability, please call early in hospitalization to schedule follow-up  [facility] Leilani Merl, [place]; Maisie Fus, MD; Charlesetta Garibaldi, MD; [place], [place]; Jeannine Kitten, MD; [place], [place] [facility] [address], [place], [place] [address] o [phone] o Mon-Fri 8:30-5:00, Sat 9:00-12:00 o Babies seen by providers at [place] Medicaid o Please register online then schedule online or call office o [email or web address] ([place] [facility]  [place]) Kristian Covey, NP; Dwyane Dee, MD; Leonidas Romberg, PA o [address], [place], [place] [address] o [phone] o Mon-Fri 8:00-5:00 o Babies seen by providers at [place] Medicaid  [place] ([facility] [employer]) Dairl Ponder, MD; Rayvon Char, NP; Melina Modena, MD o [address] [place], [place], [place] [address] o [phone] o Mon-Fri 8:00-5:30, Sat&Sun by appointment (phones open at 8:30) o Babies seen by [facility] providers o Accepting Medicaid o Must be a first-time baby or sibling of current patient  [place], [address], [place], [place]  [address] o [phone]   Fax - [phone]  [place] [phone] & [phone])  [place], [place]; [place], [place]; [place], MD; [place], [place]; Harrell Lark, MD o [address] [place], [place] [address] o [phone] o Mon-Thur 8:00-7:00, Fri 8:00-5:00, Sat 8:00-12:00, Sun 9:00-12:00 o Babies seen by [facility] providers o Accepting Medicaid  [facility], MD; Ruthann Cancer, MD; [facility], MD o [address], [place], [place] [address] o [phone] o Mon-Thur 8:00-5:00 o Babies seen by providers at [place] Medicaid  [facility] [place], MD; Coe[place]Goins, MD; Amedeo Plenty, MD; Bobby Rumpf, MD; List, MD; Lavonia Drafts, MD; Ruthann Cancer, MD; Selinda Eon, MD; Audie Box, MD; Jim Like, MD; Christie Nottingham, MD; Hubbard Hartshorn, MD; [place] Nunnery, MD o [address] [place], [place]  [address] o [phone] o Mon-Fri 8:00-5:30, Sat (Oct.-Mar.) 9:00-1:00 o Babies seen by providers at [facility] o Accepting Medicaid o Must fill out new patient packet, available online at [email or web address]  [place] ([facility]) Barnabas Lister, NP; [place]on Kingfisher, NP; Claiborne Billings, NP; Rolla Plate, MD;  [place], [place]; Carola Rhine, MD; Tyron Russell, MD; Delia Chimes, NP o [address], [place], [place] [address] o [phone] o Mon-Thur 8:00-5:30, Fri 8:00-5:00 o Babies seen by providers at [place] [phone])  [place], [place]; [place], MD; [place], MD; [place], [place] o [address] [place], [place] [address] o [phone] o Mon-Fri 8:00-5:00 o Babies seen by providers at [place] [phone])  [place] [facility] [place], [place]; Olen Pel, MD; [place], [place], [place], [place] [address] o [phone] o Mon-Fri 8:00-5:00 o Babies seen by providers at [facility] o Does NOT accept Medicaid o Limited appointment availability, please call early in hospitalization   [place] at [place], [place]; [place], [place] [address] [place], [place] [address] o [phone] o Mon-Fri 8:00-5:00 o Babies seen by [facility] providers o Does NOT accept [facility] Su [place], MD; Guy Sandifer, MD; [place], [place]; [place], MD o [place], [place], [place] [address] o [phone] o Mon-Fri 8:00-5:00 o After hours clinic [facility] [place], [place] [address]) [phone] Mon-Fri 5:00-8:00, Sat 12:00-6:00, Sun 10:00-4:00 o Babies seen by [facility] providers o Accepting Medicaid  [place] [facility] o [address] [place], [place]  [address] o [phone]   Fax - [phone]  [place] [phone])  [place] at [place], MD o [address] [place], [place], [place] [address] o [phone] o Mon-Fri 8:00-5:00 o Babies seen by [facility] providers o Does NOT accept Medicaid  [place] ([place] [facility], MD o [address], [place], [place]  [address] o [phone] o Mon-Thur 8:00-7:00, Fri 8:00-5:00, Sat 8:00-12:00 o Babies seen by providers at [facility] o Accepting Medicaid - but does not have vaccinations in office (must be received elsewhere) o Limited availability, please call early in hospitalization  [place] [phone])  [place], MD o [address] [place] [address] o [phone]  Fax [phone]   Fetal Movement Counts Patient Name: ________________________________________________ Patient Due Date: ____________________ What is a fetal movement count?  A fetal movement count is the number of times that you feel your baby move during a certain amount of time. This may also be called a fetal kick count. A fetal movement count is recommended for every pregnant woman. You may be asked to start counting fetal movements as early as week 28 of your pregnancy. Pay attention to when your baby is most active. You may notice your baby's sleep and wake cycles. You may also notice things that make your baby move more. You should do a fetal movement count:  When your baby is normally most active.  At the same time each day. A good time to count movements is while you are resting, after having something to eat and drink. How do I count fetal movements? 1. Find a quiet, comfortable area. Sit, or lie down on your  side. 2. Write down the date, the start time and stop time, and the number of movements that you felt between those two times. Take this information with you to your health care visits. 3. For 2 hours, count kicks, flutters, swishes, rolls, and jabs. You should feel at least 10 movements during 2 hours. 4. You may stop counting after you have felt 10 movements. 5. If you do not feel 10 movements in 2 hours, have something to eat and drink. Then, keep resting and counting for 1 hour. If you feel at least 4 movements during that hour, you may stop counting. Contact a health  care provider if:  You feel fewer than 4 movements in 2 hours.  Your baby is not moving like he or she usually does. Date: ____________ Start time: ____________ Stop time: ____________ Movements: ____________ Date: ____________ Start time: ____________ Stop time: ____________ Movements: ____________ Date: ____________ Start time: ____________ Stop time: ____________ Movements: ____________ Date: ____________ Start time: ____________ Stop time: ____________ Movements: ____________ Date: ____________ Start time: ____________ Stop time: ____________ Movements: ____________ Date: ____________ Start time: ____________ Stop time: ____________ Movements: ____________ Date: ____________ Start time: ____________ Stop time: ____________ Movements: ____________ Date: ____________ Start time: ____________ Stop time: ____________ Movements: ____________ Date: ____________ Start time: ____________ Stop time: ____________ Movements: ____________ This information is not intended to replace advice given to you by your health care provider. Make sure you discuss any questions you have with your health care provider. Document Released: 01/02/2007 Document Revised: 12/23/2018 Document Reviewed: 01/12/2016 Elsevier Patient Education  2020 Elsevier Inc.  [employer] of the uterus can occur throughout pregnancy, but they are not always a sign that you are in labor. You may have practice contractions called Braxton Hicks contractions. These false labor contractions are sometimes confused with true labor. What are [place] contractions? Braxton Hicks contractions are tightening movements that occur in the muscles of the uterus before labor. Unlike true labor contractions, these contractions do not result in opening (dilation) and thinning of the cervix. Toward the end of pregnancy (32-34 weeks), Braxton Hicks contractions can happen more often and may become stronger. These contractions  are sometimes difficult to tell apart from true labor because they can be very uncomfortable. You should not feel embarrassed if you go to the hospital with false labor. Sometimes, the only way to tell if you are in true labor is for your health care provider to look for changes in the cervix. The health care provider will do a physical exam and may monitor your contractions. If you are not in true labor, the exam should show that your cervix is not dilating and your water has not broken. If there are no other health problems associated with your pregnancy, it is completely safe for you to be sent home with false labor. You may continue to have Braxton Hicks contractions until you go into true labor. How to tell the difference between true labor and false labor True labor  Contractions last 30-70 seconds.  Contractions become very regular.  Discomfort is usually felt in the top of the uterus, and it spreads to the lower abdomen and low back.  Contractions do not go away with walking.  Contractions usually become more intense and increase in frequency.  The cervix dilates and gets thinner. False labor  Contractions are usually shorter and not as strong as true labor contractions.  Contractions are usually irregular.  Contractions are often felt in the front of the lower  abdomen and in the groin.  Contractions may go away when you walk around or change positions while lying down.  Contractions get weaker and are shorter-lasting as time goes on.  The cervix usually does not dilate or become thin. Follow these instructions at home:   Take over-the-counter and prescription medicines only as told by your health care provider.  Keep up with your usual exercises and follow other instructions from your health care provider.  Eat and drink lightly if you think you are going into labor.  If Braxton Hicks contractions are making you uncomfortable: ? Change your position from lying down or  resting to walking, or change from walking to resting. ? Sit and rest in a tub of warm water. ? Drink enough fluid to keep your urine pale yellow. Dehydration may cause these contractions. ? Do slow and deep breathing several times an hour.  Keep all follow-up prenatal visits as told by your health care provider. This is important. Contact a health care provider if:  You have a fever.  You have continuous pain in your abdomen. Get help right away if:  Your contractions become stronger, more regular, and closer together.  You have fluid leaking or gushing from your vagina.  You pass blood-tinged mucus (bloody show).  You have bleeding from your vagina.  You have low back pain that you never had before.  You feel your babys head pushing down and causing pelvic pressure.  Your baby is not moving inside you as much as it used to. Summary  Contractions that occur before labor are called Braxton Hicks contractions, false labor, or practice contractions.  Braxton Hicks contractions are usually shorter, weaker, farther apart, and less regular than true labor contractions. True labor contractions usually become progressively stronger and regular, and they become more frequent.  Manage discomfort from [facility] contractions by changing position, resting in a warm bath, drinking plenty of water, or practicing deep breathing. This information is not intended to replace advice given to you by your health care provider. Make sure you discuss any questions you have with your health care provider. Document Released: 04/18/2017 Document Revised: 11/15/2017 Document Reviewed: 04/18/2017 Elsevier Patient Education  2020 [employer].

## 2019-11-10 NOTE — Progress Notes (Signed)
   PRENATAL VISIT NOTE  Subjective:  Susan Rivera is a 23 y.o. G2P1001 at [redacted]w[redacted]d being seen today for ongoing prenatal care.  She is currently monitored for the following issues for this low-risk pregnancy and has Gonorrhea; Chlamydia; Supervision of other normal pregnancy, antepartum; and Anemia in pregnancy on their problem list.  Patient reports no complaints.  Contractions: Irritability. Vag. Bleeding: None.  Movement: Present. Denies leaking of fluid.   The following portions of the patient's history were reviewed and updated as appropriate: allergies, current medications, past family history, past medical history, past social history, past surgical history and problem list.   Objective:   Vitals:   11/10/19 1354  BP: 120/77  Pulse: 96  Weight: 151 lb 9.6 oz (68.8 kg)    Fetal Status: Fetal Heart Rate (bpm): 134 Fundal Height: 37 cm Movement: Present     General:  Alert, oriented and cooperative. Patient is in no acute distress.  Skin: Skin is warm and dry. No rash noted.   Cardiovascular: Normal heart rate noted  Respiratory: Normal respiratory effort, no problems with respiration noted  Abdomen: Soft, gravid, appropriate for gestational age.  Pain/Pressure: Present     Pelvic: Cervical exam deferred        Extremities: Normal range of motion.  Edema: None  Mental Status: Normal mood and affect. Normal behavior. Normal judgment and thought content.   Assessment and Plan:  Pregnancy: G2P1001 at [redacted]w[redacted]d 1. Supervision of other normal pregnancy, antepartum - Doing well, no complaints  - Peds list provided  - Desires BTL and inpatient circ - advised to call [employer] for more information as soon as possible   Term labor symptoms and general obstetric precautions including but not limited to vaginal bleeding, contractions, leaking of fluid and fetal movement were reviewed in detail with the patient. Please refer to After Visit Summary for other counseling recommendations.   Return  in about 1 week (around 11/17/2019) for LOB, In-Person.  No future appointments.  Kerry Hough, PA-C

## 2019-11-17 ENCOUNTER — Ambulatory Visit (INDEPENDENT_AMBULATORY_CARE_PROVIDER_SITE_OTHER): Payer: BC Managed Care – PPO | Admitting: Advanced Practice Midwife

## 2019-11-17 ENCOUNTER — Telehealth: Payer: Self-pay | Admitting: Family Medicine

## 2019-11-17 ENCOUNTER — Other Ambulatory Visit: Payer: Self-pay

## 2019-11-17 VITALS — BP 114/72 | HR 68 | Wt 146.7 lb

## 2019-11-17 DIAGNOSIS — Z3483 Encounter for supervision of other normal pregnancy, third trimester: Secondary | ICD-10-CM

## 2019-11-17 DIAGNOSIS — Z3480 Encounter for supervision of other normal pregnancy, unspecified trimester: Secondary | ICD-10-CM

## 2019-11-17 DIAGNOSIS — Z3A38 38 weeks gestation of pregnancy: Secondary | ICD-10-CM

## 2019-11-17 NOTE — Telephone Encounter (Signed)
Patient came to my desk after her visit, she was crying really bad, she stated to me "just make my next appointment so I can get out of here" she then said do not schedule me with the doctor I just saw as a matter of fact give me her name so I can report here, I printed her AVS and told her I will make her next appointment and she will get it through her Mychart, as I was doing that the patient stared crying harder,  I took her out to the stairwell away from everybody as asked her why was she so upset, patient stated that she kept telling   Susan Rivera  To stop you are being to rough as she was checking her cervix, the patient stated that Susan Rivera was being really rude to her and she her hurt, the patient stated she has never been treated like that before, I told [name] I will let my manager Cathlean Marseilles) know about this and she will give you a call.  Patient then left

## 2019-11-17 NOTE — Patient Instructions (Signed)

## 2019-11-17 NOTE — Progress Notes (Addendum)
   PRENATAL VISIT NOTE  Subjective:  Susan Rivera is a 23 y.o. G2P1001 at [redacted]w[redacted]d being seen today for ongoing prenatal care.  She is currently monitored for the following issues for this low-risk pregnancy and has Gonorrhea; Chlamydia; Supervision of other normal pregnancy, antepartum; and Anemia in pregnancy on their problem list.  Patient reports no complaints. She is requesting membrane sweep today. Contractions: Irregular. Vag. Bleeding: None.  Movement: Present. Denies leaking of fluid.   The following portions of the patient's history were reviewed and updated as appropriate: allergies, current medications, past family history, past medical history, past social history, past surgical history and problem list. Problem list updated.  Objective:   Vitals:   11/17/19 1000  BP: 114/72  Pulse: 68  Weight: 146 lb 11.2 oz (66.5 kg)    Fetal Status: Fetal Heart Rate (bpm): 125   Movement: Present     General:  Alert, oriented and cooperative. Patient is in no acute distress.  Skin: Skin is warm and dry. No rash noted.   Cardiovascular: Normal heart rate noted  Respiratory: Normal respiratory effort, no problems with respiration noted  Abdomen: Soft, gravid, appropriate for gestational age.  Pain/Pressure: Present     Pelvic: Cervical exam attempted (see my comments below)        Extremities: Normal range of motion.  Edema: None  Mental Status: Normal mood and affect. Normal behavior. Normal judgment and thought content.   Assessment and Plan:  Pregnancy: G2P1001 at [redacted]w[redacted]d  1. Supervision of other normal pregnancy, antepartum --Recent transfer of care from [place] --GBS negative --Explained to patient that cervical ripening via membrane sweep is typically offered at 39 and 40 weeks and I did not feel comfortable offering today as it could constitute elective induction prior to 3 weeks and carried risk of SROM. Discussed that there is an excellent Cochrane review supporting membrane  sweep at 39 and 40 weeks. Also offered that there are cervical ripening methods she can try at home. --Patient requested cervical exam but had a strong negative physical reaction with insertion at which time I immediately removed my hand and stepped away from the exam table --Patient verbalized that my exam "felt rough" and "forced" compared to previous exam by Dr. Ilda Basset. I apologized and explained to patient that I had not yet made contact with her cervix. She verbalized that she wanted to "just get it over with". I replied that a digital exam was not necessary today and I did not feel comfortable re-attempting a cervical exam given patient's poor tolerance of previous attempt, use of the word "forced" and increasingly agitated state.   --I requested to measure patient's fundus. She declined and verbalized desire to leave immediately --Patient verbalized in room and throughout lobby that she had "driven 4 hours from [place] for nothing" --Quarry manager notified of patient's dissatisfaction with care provided today  Term labor symptoms and general obstetric precautions including but not limited to vaginal bleeding, contractions, leaking of fluid and fetal movement were reviewed in detail with the patient. Please refer to After Visit Summary for other counseling recommendations.  Return in about 1 week (around 11/24/2019).  Future Appointments  Date Time Provider [place]  11/24/2019 11:15 AM Jorje Guild, NP [place], [place]

## 2019-11-23 ENCOUNTER — Telehealth: Payer: Self-pay | Admitting: Student

## 2019-11-23 NOTE — Telephone Encounter (Signed)
Received a call from the patient stating she did not want to come to her appointment because the last time she was here, she wasted her time. She said she lives out of town and will not have a ride. She wanted the provider to call her with a induction date. I was able to convince her to do a MyChart visit, and then after speaking to the provider she would be informed on the rest of her visits. She was very thankful.

## 2019-11-24 ENCOUNTER — Other Ambulatory Visit: Payer: Self-pay

## 2019-11-24 ENCOUNTER — Telehealth (INDEPENDENT_AMBULATORY_CARE_PROVIDER_SITE_OTHER): Payer: BC Managed Care – PPO | Admitting: Student

## 2019-11-24 DIAGNOSIS — Z3480 Encounter for supervision of other normal pregnancy, unspecified trimester: Secondary | ICD-10-CM

## 2019-11-24 DIAGNOSIS — Z3A39 39 weeks gestation of pregnancy: Secondary | ICD-10-CM

## 2019-11-24 DIAGNOSIS — Z3483 Encounter for supervision of other normal pregnancy, third trimester: Secondary | ICD-10-CM

## 2019-11-24 NOTE — Progress Notes (Signed)
I connected with@ on 11/24/19 at 11:15 AM EST by: mychart video and verified that I am speaking with the correct person using two identifiers.  Patient is located at home and provider is located at [facility].     The purpose of this virtual visit is to provide medical care while limiting exposure to the novel coronavirus. I discussed the limitations, risks, security and privacy concerns of performing an evaluation and management service by mychart video and the availability of in person appointments. I also discussed with the patient that there may be a patient responsible charge related to this service. By engaging in this virtual visit, you consent to the provision of healthcare.  Additionally, you authorize for your insurance to be billed for the services provided during this visit.  The patient expressed understanding and agreed to proceed.  The following staff members participated in the virtual visit:  Jorje Guild NP    PRENATAL VISIT NOTE  Subjective:  Susan Rivera is a 23 y.o. G2P1001 at [redacted]w[redacted]d  for phone visit for ongoing prenatal care.  She is currently monitored for the following issues for this low-risk pregnancy and has Gonorrhea; Chlamydia; Supervision of other normal pregnancy, antepartum; and Anemia in pregnancy on their problem list.  Patient reports no complaints.  Contractions: Irritability. Vag. Bleeding: None.  Movement: Present. Denies leaking of fluid.   The following portions of the patient's history were reviewed and updated as appropriate: allergies, current medications, past family history, past medical history, past social history, past surgical history and problem list.   Objective:  There were no vitals filed for this visit. Self-Obtained  Fetal Status:     Movement: Present     Assessment and Plan:  Pregnancy: G2P1001 at [redacted]w[redacted]d 1. Supervision of other normal pregnancy, antepartum -did not have her BP cuff today. Was seen in ED yesterday for dental pain & had  normal BPs.  -no complaints, doing well -Scheduled for 41 week induction. Will include information regarding induction of labor in her AVS. Discussed that the hospital will call her for pre admission screening & to schedule her COVID testing. She will be called the day of her induction date with the time of her induction slot. Discussed visitor policy for the hospital.  -Aware that she will have an in person visit next week for BPP/AFI.    Term labor symptoms and general obstetric precautions including but not limited to vaginal bleeding, contractions, leaking of fluid and fetal movement were reviewed in detail with the patient.  Return in about 6 days (around 11/30/2019) for Routine OB & BPP/AFI.  No future appointments.   Time spent on virtual visit: 8 minutes  Jorje Guild, NP

## 2019-11-24 NOTE — Progress Notes (Signed)
I connected with  Susan Rivera on 11/24/19 at 11:15 AM EST by telephone and verified that I am speaking with the correct person using two identifiers.   I discussed the limitations, risks, security and privacy concerns of performing an evaluation and management service by telephone and the availability of in person appointments. I also discussed with the patient that there may be a patient responsible charge related to this service. The patient expressed understanding and agreed to proceed.  Aviva Signs Jasin Brazel, CMA 11/24/2019  11:08 AM   No BP cuff

## 2019-11-24 NOTE — Patient Instructions (Signed)

## 2019-11-25 ENCOUNTER — Telehealth (HOSPITAL_COMMUNITY): Payer: Self-pay | Admitting: *Deleted

## 2019-11-25 NOTE — Telephone Encounter (Signed)
Preadmission screen  

## 2019-11-26 ENCOUNTER — Telehealth (HOSPITAL_COMMUNITY): Payer: Self-pay | Admitting: *Deleted

## 2019-11-26 NOTE — Telephone Encounter (Signed)
Preadmission screen  

## 2019-11-30 ENCOUNTER — Telehealth (HOSPITAL_COMMUNITY): Payer: Self-pay | Admitting: *Deleted

## 2019-11-30 ENCOUNTER — Encounter (HOSPITAL_COMMUNITY): Payer: Self-pay | Admitting: *Deleted

## 2019-11-30 NOTE — Telephone Encounter (Signed)
Preadmission screen  

## 2019-12-01 ENCOUNTER — Other Ambulatory Visit: Payer: Self-pay | Admitting: Women's Health

## 2019-12-01 ENCOUNTER — Encounter: Payer: Self-pay | Admitting: General Practice

## 2019-12-02 ENCOUNTER — Other Ambulatory Visit: Payer: Self-pay | Admitting: Advanced Practice Midwife

## 2019-12-02 ENCOUNTER — Ambulatory Visit (INDEPENDENT_AMBULATORY_CARE_PROVIDER_SITE_OTHER): Payer: BC Managed Care – PPO | Admitting: *Deleted

## 2019-12-02 ENCOUNTER — Ambulatory Visit (INDEPENDENT_AMBULATORY_CARE_PROVIDER_SITE_OTHER): Payer: BC Managed Care – PPO | Admitting: Medical

## 2019-12-02 ENCOUNTER — Other Ambulatory Visit (HOSPITAL_COMMUNITY): Payer: BC Managed Care – PPO | Attending: Obstetrics and Gynecology

## 2019-12-02 ENCOUNTER — Other Ambulatory Visit: Payer: Self-pay

## 2019-12-02 ENCOUNTER — Ambulatory Visit: Payer: Self-pay

## 2019-12-02 ENCOUNTER — Other Ambulatory Visit: Payer: Self-pay | Admitting: Student

## 2019-12-02 VITALS — BP 110/69 | HR 83 | Wt 157.4 lb

## 2019-12-02 DIAGNOSIS — O480 Post-term pregnancy: Secondary | ICD-10-CM

## 2019-12-02 DIAGNOSIS — Z3480 Encounter for supervision of other normal pregnancy, unspecified trimester: Secondary | ICD-10-CM

## 2019-12-02 DIAGNOSIS — Z3A40 40 weeks gestation of pregnancy: Secondary | ICD-10-CM

## 2019-12-02 DIAGNOSIS — Z20822 Contact with and (suspected) exposure to covid-19: Secondary | ICD-10-CM

## 2019-12-02 DIAGNOSIS — O99013 Anemia complicating pregnancy, third trimester: Secondary | ICD-10-CM

## 2019-12-02 NOTE — Progress Notes (Signed)
   PRENATAL VISIT NOTE  Subjective:  Susan Rivera is a 23 y.o. G2P1001 at [redacted]w[redacted]d being seen today for ongoing prenatal care.  She is currently monitored for the following issues for this low-risk pregnancy and has Gonorrhea; Chlamydia; Supervision of other normal pregnancy, antepartum; and Anemia in pregnancy on their problem list.  Patient reports no complaints.  Contractions: Irritability. Vag. Bleeding: None.  Movement: Present. Denies leaking of fluid.   The following portions of the patient's history were reviewed and updated as appropriate: allergies, current medications, past family history, past medical history, past social history, past surgical history and problem list.   Objective:   Vitals:   12/02/19 1444  BP: 110/69  Pulse: 83  Weight: 157 lb 6.4 oz (71.4 kg)    Fetal Status: Fetal Heart Rate (bpm): 132 Fundal Height: 40 cm Movement: Present     General:  Alert, oriented and cooperative. Patient is in no acute distress.  Skin: Skin is warm and dry. No rash noted.   Cardiovascular: Normal heart rate noted  Respiratory: Normal respiratory effort, no problems with respiration noted  Abdomen: Soft, gravid, appropriate for gestational age.  Pain/Pressure: Present     Pelvic: Cervical exam deferred        Extremities: Normal range of motion.  Edema: None  Mental Status: Normal mood and affect. Normal behavior. Normal judgment and thought content.   Assessment and Plan:  Pregnancy: G2P1001 at [redacted]w[redacted]d 1. Supervision of other normal pregnancy, antepartum - post dates today. IOL already scheduled for Friday - GBS negative  - Planning BTL   2. Anemia during pregnancy in third trimester - Taking iron   Term labor symptoms and general obstetric precautions including but not limited to vaginal bleeding, contractions, leaking of fluid and fetal movement were reviewed in detail with the patient. Please refer to After Visit Summary for other counseling recommendations.   Return  in about 4 weeks (around 12/30/2019) for PP visit.  Future Appointments  Date Time Provider [place]  12/04/2019  7:00 AM [facility] [place] None    Kerry Hough, PA-C

## 2019-12-02 NOTE — Patient Instructions (Signed)
Fetal Movement Counts Patient Name: ________________________________________________ Patient Due Date: ____________________ What is a fetal movement count?  A fetal movement count is the number of times that you feel your baby move during a certain amount of time. This may also be called a fetal kick count. A fetal movement count is recommended for every pregnant woman. You may be asked to start counting fetal movements as early as week 28 of your pregnancy. Pay attention to when your baby is most active. You may notice your baby's sleep and wake cycles. You may also notice things that make your baby move more. You should do a fetal movement count:  When your baby is normally most active.  At the same time each day. A good time to count movements is while you are resting, after having something to eat and drink. How do I count fetal movements? 1. Find a quiet, comfortable area. Sit, or lie down on your side. 2. Write down the date, the start time and stop time, and the number of movements that you felt between those two times. Take this information with you to your health care visits. 3. For 2 hours, count kicks, flutters, swishes, rolls, and jabs. You should feel at least 10 movements during 2 hours. 4. You may stop counting after you have felt 10 movements. 5. If you do not feel 10 movements in 2 hours, have something to eat and drink. Then, keep resting and counting for 1 hour. If you feel at least 4 movements during that hour, you may stop counting. Contact a health care provider if:  You feel fewer than 4 movements in 2 hours.  Your baby is not moving like he or she usually does. Date: ____________ Start time: ____________ Stop time: ____________ Movements: ____________ Date: ____________ Start time: ____________ Stop time: ____________ Movements: ____________ Date: ____________ Start time: ____________ Stop time: ____________ Movements: ____________ Date: ____________ Start time:  ____________ Stop time: ____________ Movements: ____________ Date: ____________ Start time: ____________ Stop time: ____________ Movements: ____________ Date: ____________ Start time: ____________ Stop time: ____________ Movements: ____________ Date: ____________ Start time: ____________ Stop time: ____________ Movements: ____________ Date: ____________ Start time: ____________ Stop time: ____________ Movements: ____________ Date: ____________ Start time: ____________ Stop time: ____________ Movements: ____________ This information is not intended to replace advice given to you by your health care provider. Make sure you discuss any questions you have with your health care provider. Document Released: 01/02/2007 Document Revised: 12/23/2018 Document Reviewed: 01/12/2016 Elsevier Patient Education  2020 [employer] Braxton Hicks Contractions Contractions of the uterus can occur throughout pregnancy, but they are not always a sign that you are in labor. You may have practice contractions called Braxton Hicks contractions. These false labor contractions are sometimes confused with true labor. What are Braxton Hicks contractions? Braxton Hicks contractions are tightening movements that occur in the muscles of the uterus before labor. Unlike true labor contractions, these contractions do not result in opening (dilation) and thinning of the cervix. Toward the end of pregnancy (32-34 weeks), Braxton Hicks contractions can happen more often and may become stronger. These contractions are sometimes difficult to tell apart from true labor because they can be very uncomfortable. You should not feel embarrassed if you go to the hospital with false labor. Sometimes, the only way to tell if you are in true labor is for your health care provider to look for changes in the cervix. The health care provider will do a physical exam and may monitor your contractions. If you   are not in true labor, the exam should show  that your cervix is not dilating and your water has not broken. If there are no other health problems associated with your pregnancy, it is completely safe for you to be sent home with false labor. You may continue to have Braxton Hicks contractions until you go into true labor. How to tell the difference between true labor and false labor True labor  Contractions last 30-70 seconds.  Contractions become very regular.  Discomfort is usually felt in the top of the uterus, and it spreads to the lower abdomen and low back.  Contractions do not go away with walking.  Contractions usually become more intense and increase in frequency.  The cervix dilates and gets thinner. False labor  Contractions are usually shorter and not as strong as true labor contractions.  Contractions are usually irregular.  Contractions are often felt in the front of the lower abdomen and in the groin.  Contractions may go away when you walk around or change positions while lying down.  Contractions get weaker and are shorter-lasting as time goes on.  The cervix usually does not dilate or become thin. Follow these instructions at home:   Take over-the-counter and prescription medicines only as told by your health care provider.  Keep up with your usual exercises and follow other instructions from your health care provider.  Eat and drink lightly if you think you are going into labor.  If Braxton Hicks contractions are making you uncomfortable: ? Change your position from lying down or resting to walking, or change from walking to resting. ? Sit and rest in a tub of warm water. ? Drink enough fluid to keep your urine pale yellow. Dehydration may cause these contractions. ? Do slow and deep breathing several times an hour.  Keep all follow-up prenatal visits as told by your health care provider. This is important. Contact a health care provider if:  You have a fever.  You have continuous pain in  your abdomen. Get help right away if:  Your contractions become stronger, more regular, and closer together.  You have fluid leaking or gushing from your vagina.  You pass blood-tinged mucus (bloody show).  You have bleeding from your vagina.  You have low back pain that you never had before.  You feel your baby's head pushing down and causing pelvic pressure.  Your baby is not moving inside you as much as it used to. Summary  Contractions that occur before labor are called Braxton Hicks contractions, false labor, or practice contractions.  Braxton Hicks contractions are usually shorter, weaker, farther apart, and less regular than true labor contractions. True labor contractions usually become progressively stronger and regular, and they become more frequent.  Manage discomfort from Braxton Hicks contractions by changing position, resting in a warm bath, drinking plenty of water, or practicing deep breathing. This information is not intended to replace advice given to you by your health care provider. Make sure you discuss any questions you have with your health care provider. Document Released: 04/18/2017 Document Revised: 11/15/2017 Document Reviewed: 04/18/2017 Elsevier Patient Education  2020 Elsevier Inc.  

## 2019-12-03 ENCOUNTER — Telehealth: Payer: Self-pay | Admitting: Lactation Services

## 2019-12-03 ENCOUNTER — Inpatient Hospital Stay (EMERGENCY_DEPARTMENT_HOSPITAL)
Admission: AD | Admit: 2019-12-03 | Discharge: 2019-12-03 | Disposition: A | Discharge status: Home / Self Care | Payer: BC Managed Care – PPO | Source: Home / Self Care | Attending: Obstetrics & Gynecology | Admitting: Obstetrics & Gynecology

## 2019-12-03 ENCOUNTER — Encounter (HOSPITAL_COMMUNITY): Payer: Self-pay | Admitting: Obstetrics & Gynecology

## 2019-12-03 DIAGNOSIS — Z0371 Encounter for suspected problem with amniotic cavity and membrane ruled out: Secondary | ICD-10-CM | POA: Diagnosis not present

## 2019-12-03 DIAGNOSIS — Z3A40 40 weeks gestation of pregnancy: Secondary | ICD-10-CM | POA: Diagnosis not present

## 2019-12-03 DIAGNOSIS — Z3480 Encounter for supervision of other normal pregnancy, unspecified trimester: Secondary | ICD-10-CM

## 2019-12-03 DIAGNOSIS — O480 Post-term pregnancy: Secondary | ICD-10-CM | POA: Diagnosis not present

## 2019-12-03 LAB — POCT FERN TEST: POCT Fern Test: NEGATIVE

## 2019-12-03 LAB — NOVEL CORONAVIRUS, NAA: SARS-CoV-2, NAA: NOT DETECTED

## 2019-12-03 NOTE — Telephone Encounter (Signed)
Pt concerned that she had some discharge that was watery in nature, it went through her panties and was on her bed about the size of her palm. She reports it is clear to white. Pt is not sure if she is having discharge. She just went to the bathroom and when she wiped it was watery/mucousy again. Her due date was 11/26/2019  She is not aware that she is having contractions. Advised pt to go to the M[facility] Assessment Unit for evaluation for ROM. Pt voiced understanding and is aware of where to go.   Felicia, RN in MAU notified by phone that pt is coming in for evaluation.

## 2019-12-03 NOTE — [facility]U Provider Note (Signed)
S: [name] is a 23 y.o. G2P1001 at [redacted]w[redacted]d  who presents to MAU today complaining of leaking of fluid since this AM. She denies vaginal bleeding. She denies contractions. She reports normal fetal movement.    O: BP 125/72   Pulse 92   Temp 98 F (36.7 C)   Resp 18   Wt 71 kg   LMP 02/06/2019   SpO2 100%   BMI 29.58 kg/m  GENERAL: Well-developed, well-nourished female in no acute distress.  HEAD: Normocephalic, atraumatic.  CHEST: Normal effort of breathing, regular heart rate ABDOMEN: Soft, nontender, gravid PELVIC: Normal external female genitalia. Vagina is pink and rugated. Cervix with normal contour, no lesions. Normal discharge.  Negative pooling.   Cervical exam:  Dilation: Closed Exam by:: J. Cayton Cuevas, NP   Fetal Monitoring: Baseline: 125 bpm Variability: moderate  Accelerations: 15x15 Decelerations: None Contractions: occasional   Results for orders placed or performed during the hospital encounter of 12/03/19 (from the past 24 hour(s))  Fern Test     Status: Normal   Collection Time: 12/03/19  2:35 PM  Result Value Ref Range   POCT Fern Test Negative = intact amniotic membranes      A: SIUP at [redacted]w[redacted]d  Membranes intact  P: Strict return precautions   Return to MAU if symptoms worsen Fetal kick counts  Noni Saupe I, NP 12/03/2019 2:18 PM

## 2019-12-03 NOTE — Discharge Instructions (Signed)

## 2019-12-03 NOTE — MAU Note (Signed)
.   Susan Rivera is a 23 y.o. at [redacted]w[redacted]d here in MAU reporting: LOF that started approximately an hour ago. Denies any VB. +FM  Pt is scheduled for IOL tomorrow  Onset of complaint: lunchtime today Pain score: 0 Vitals:   12/03/19 1303  BP: 125/72  Pulse: 92  Resp: 18  Temp: 98 F (36.7 C)  SpO2: 100%     FHT:125 Lab orders placed from triage:

## 2019-12-04 ENCOUNTER — Inpatient Hospital Stay (HOSPITAL_COMMUNITY): Admit: 2019-12-04 | Payer: BC Managed Care – PPO

## 2019-12-04 ENCOUNTER — Other Ambulatory Visit: Payer: Self-pay

## 2019-12-04 ENCOUNTER — Inpatient Hospital Stay (HOSPITAL_COMMUNITY): Payer: BC Managed Care – PPO | Admitting: Anesthesiology

## 2019-12-04 ENCOUNTER — Inpatient Hospital Stay (HOSPITAL_COMMUNITY)
Admission: AD | Admit: 2019-12-04 | Discharge: 2019-12-06 | DRG: 807 | Disposition: A | Discharge status: Home / Self Care | Payer: BC Managed Care – PPO | Attending: Obstetrics & Gynecology | Admitting: Obstetrics & Gynecology

## 2019-12-04 ENCOUNTER — Encounter (HOSPITAL_COMMUNITY): Payer: Self-pay | Admitting: Obstetrics and Gynecology

## 2019-12-04 ENCOUNTER — Inpatient Hospital Stay (HOSPITAL_COMMUNITY): Payer: BC Managed Care – PPO

## 2019-12-04 DIAGNOSIS — Z3A41 41 weeks gestation of pregnancy: Secondary | ICD-10-CM | POA: Diagnosis not present

## 2019-12-04 DIAGNOSIS — Z87891 Personal history of nicotine dependence: Secondary | ICD-10-CM

## 2019-12-04 DIAGNOSIS — Z3A38 38 weeks gestation of pregnancy: Secondary | ICD-10-CM

## 2019-12-04 DIAGNOSIS — O99019 Anemia complicating pregnancy, unspecified trimester: Secondary | ICD-10-CM | POA: Diagnosis present

## 2019-12-04 DIAGNOSIS — O9902 Anemia complicating childbirth: Secondary | ICD-10-CM | POA: Diagnosis present

## 2019-12-04 DIAGNOSIS — D649 Anemia, unspecified: Secondary | ICD-10-CM | POA: Diagnosis present

## 2019-12-04 DIAGNOSIS — O480 Post-term pregnancy: Secondary | ICD-10-CM | POA: Diagnosis present

## 2019-12-04 LAB — TYPE AND SCREEN
ABO/RH(D): B POS
Antibody Screen: NEGATIVE

## 2019-12-04 LAB — CBC
HCT: 32.3 % — ABNORMAL LOW (ref 36.0–46.0)
Hemoglobin: 9.8 g/dL — ABNORMAL LOW (ref 12.0–15.0)
MCH: 23.3 pg — ABNORMAL LOW (ref 26.0–34.0)
MCHC: 30.3 g/dL (ref 30.0–36.0)
MCV: 76.9 fL — ABNORMAL LOW (ref 80.0–100.0)
Platelets: 269 10*3/uL (ref 150–400)
RBC: 4.2 MIL/uL (ref 3.87–5.11)
RDW: 17.6 % — ABNORMAL HIGH (ref 11.5–15.5)
WBC: 9.5 10*3/uL (ref 4.0–10.5)
nRBC: 0 % (ref 0.0–0.2)

## 2019-12-04 LAB — RPR: RPR Ser Ql: NONREACTIVE

## 2019-12-04 MED ORDER — DIPHENHYDRAMINE HCL 50 MG/ML IJ SOLN: 12.5000 mg | INTRAMUSCULAR | Status: DC | PRN

## 2019-12-04 MED ORDER — EPHEDRINE 5 MG/ML INJ: 10.0000 mg | INTRAVENOUS | Status: DC | PRN

## 2019-12-04 MED ORDER — LACTATED RINGERS IV SOLN: INTRAVENOUS | Status: DC

## 2019-12-04 MED ORDER — TERBUTALINE SULFATE 1 MG/ML IJ SOLN: 0.2500 mg | Freq: Once | INTRAMUSCULAR | Status: DC | PRN

## 2019-12-04 MED ORDER — FLEET ENEMA 7-19 GM/118ML RE ENEM: 1.0000 | ENEMA | Freq: Every day | RECTAL | Status: DC | PRN

## 2019-12-04 MED ORDER — ACETAMINOPHEN 325 MG PO TABS: 650.0000 mg | ORAL_TABLET | ORAL | Status: DC | PRN

## 2019-12-04 MED ORDER — MISOPROSTOL 25 MCG QUARTER TABLET
25.0000 ug | ORAL_TABLET | ORAL | Status: DC | PRN
  Filled 2019-12-04: qty 1

## 2019-12-04 MED ORDER — ZOLPIDEM TARTRATE 5 MG PO TABS: 5.0000 mg | ORAL_TABLET | Freq: Every evening | ORAL | Status: DC | PRN

## 2019-12-04 MED ORDER — FENTANYL-BUPIVACAINE-NACL 0.5-0.125-0.9 MG/250ML-% EP SOLN: 12.0000 mL/h | EPIDURAL | Status: DC | PRN

## 2019-12-04 MED ORDER — FENTANYL CITRATE (PF) 100 MCG/2ML IJ SOLN
INTRAMUSCULAR | Status: AC
  Filled 2019-12-04: qty 2

## 2019-12-04 MED ORDER — OXYTOCIN 40 UNITS IN NORMAL SALINE INFUSION - SIMPLE MED
1.0000 m[IU]/min | INTRAVENOUS | Status: DC
  Administered 2019-12-04: 2 m[IU]/min via INTRAVENOUS

## 2019-12-04 MED ORDER — SIMETHICONE 80 MG PO CHEW: 80.0000 mg | CHEWABLE_TABLET | ORAL | Status: DC | PRN

## 2019-12-04 MED ORDER — ONDANSETRON HCL 4 MG/2ML IJ SOLN: 4.0000 mg | INTRAMUSCULAR | Status: DC | PRN

## 2019-12-04 MED ORDER — LIDOCAINE HCL (PF) 1 % IJ SOLN
INTRAMUSCULAR | Status: DC | PRN
  Administered 2019-12-04: 10 mL via EPIDURAL

## 2019-12-04 MED ORDER — BENZOCAINE-MENTHOL 20-0.5 % EX AERO
1.0000 "application " | INHALATION_SPRAY | CUTANEOUS | Status: DC | PRN
  Administered 2019-12-06: 1 via TOPICAL
  Filled 2019-12-04: qty 56

## 2019-12-04 MED ORDER — WITCH HAZEL-GLYCERIN EX PADS: 1.0000 "application " | MEDICATED_PAD | CUTANEOUS | Status: DC | PRN

## 2019-12-04 MED ORDER — OXYCODONE-ACETAMINOPHEN 5-325 MG PO TABS: 1.0000 | ORAL_TABLET | ORAL | Status: DC | PRN

## 2019-12-04 MED ORDER — ONDANSETRON HCL 4 MG PO TABS: 4.0000 mg | ORAL_TABLET | ORAL | Status: DC | PRN

## 2019-12-04 MED ORDER — FENTANYL CITRATE (PF) 100 MCG/2ML IJ SOLN
50.0000 ug | Freq: Once | INTRAMUSCULAR | Status: AC
  Administered 2019-12-04: 50 ug via INTRAVENOUS

## 2019-12-04 MED ORDER — IBUPROFEN 600 MG PO TABS
600.0000 mg | ORAL_TABLET | Freq: Four times a day (QID) | ORAL | Status: DC
  Administered 2019-12-04 – 2019-12-06 (×6): 600 mg via ORAL
  Filled 2019-12-04 (×8): qty 1

## 2019-12-04 MED ORDER — SODIUM CHLORIDE (PF) 0.9 % IJ SOLN
INTRAMUSCULAR | Status: DC | PRN
  Administered 2019-12-04: 12 mL/h via EPIDURAL

## 2019-12-04 MED ORDER — FENTANYL-BUPIVACAINE-NACL 0.5-0.125-0.9 MG/250ML-% EP SOLN
EPIDURAL | Status: AC
  Filled 2019-12-04: qty 250

## 2019-12-04 MED ORDER — DIPHENHYDRAMINE HCL 25 MG PO CAPS: 25.0000 mg | ORAL_CAPSULE | Freq: Four times a day (QID) | ORAL | Status: DC | PRN

## 2019-12-04 MED ORDER — ONDANSETRON HCL 4 MG/2ML IJ SOLN: 4.0000 mg | Freq: Four times a day (QID) | INTRAMUSCULAR | Status: DC | PRN

## 2019-12-04 MED ORDER — OXYTOCIN 40 UNITS IN NORMAL SALINE INFUSION - SIMPLE MED
2.5000 [IU]/h | INTRAVENOUS | Status: DC
  Filled 2019-12-04: qty 1000

## 2019-12-04 MED ORDER — MISOPROSTOL 50MCG HALF TABLET
50.0000 ug | ORAL_TABLET | ORAL | Status: DC | PRN
  Administered 2019-12-04: 50 ug via ORAL
  Filled 2019-12-04: qty 1

## 2019-12-04 MED ORDER — LACTATED RINGERS IV SOLN: 500.0000 mL | Freq: Once | INTRAVENOUS | Status: DC

## 2019-12-04 MED ORDER — ACETAMINOPHEN 325 MG PO TABS
650.0000 mg | ORAL_TABLET | ORAL | Status: DC | PRN
  Administered 2019-12-04 – 2019-12-05 (×2): 650 mg via ORAL
  Filled 2019-12-04 (×3): qty 2

## 2019-12-04 MED ORDER — LACTATED RINGERS IV SOLN: 500.0000 mL | INTRAVENOUS | Status: DC | PRN

## 2019-12-04 MED ORDER — OXYCODONE-ACETAMINOPHEN 5-325 MG PO TABS: 2.0000 | ORAL_TABLET | ORAL | Status: DC | PRN

## 2019-12-04 MED ORDER — COCONUT OIL OIL
1.0000 "application " | TOPICAL_OIL | Status: DC | PRN
  Administered 2019-12-05: 1 via TOPICAL

## 2019-12-04 MED ORDER — OXYTOCIN BOLUS FROM INFUSION
500.0000 mL | Freq: Once | INTRAVENOUS | Status: AC
  Administered 2019-12-04: 500 mL via INTRAVENOUS

## 2019-12-04 MED ORDER — PHENYLEPHRINE 40 MCG/ML (10ML) SYRINGE FOR IV PUSH (FOR BLOOD PRESSURE SUPPORT): 80.0000 ug | PREFILLED_SYRINGE | INTRAVENOUS | Status: DC | PRN

## 2019-12-04 MED ORDER — DIBUCAINE (PERIANAL) 1 % EX OINT: 1.0000 "application " | TOPICAL_OINTMENT | CUTANEOUS | Status: DC | PRN

## 2019-12-04 MED ORDER — SENNOSIDES-DOCUSATE SODIUM 8.6-50 MG PO TABS
2.0000 | ORAL_TABLET | ORAL | Status: DC
  Administered 2019-12-04 – 2019-12-05 (×2): 2 via ORAL
  Filled 2019-12-04 (×2): qty 2

## 2019-12-04 MED ORDER — LIDOCAINE HCL (PF) 1 % IJ SOLN: 30.0000 mL | INTRAMUSCULAR | Status: DC | PRN

## 2019-12-04 MED ORDER — SOD CITRATE-CITRIC ACID 500-334 MG/5ML PO SOLN: 30.0000 mL | ORAL | Status: DC | PRN

## 2019-12-04 MED ORDER — PRENATAL MULTIVITAMIN CH
1.0000 | ORAL_TABLET | Freq: Every day | ORAL | Status: DC
  Administered 2019-12-05 – 2019-12-06 (×2): 1 via ORAL
  Filled 2019-12-04 (×2): qty 1

## 2019-12-04 MED ORDER — TETANUS-DIPHTH-ACELL PERTUSSIS 5-2.5-18.5 LF-MCG/0.5 IM SUSP: 0.5000 mL | Freq: Once | INTRAMUSCULAR | Status: DC

## 2019-12-04 NOTE — Anesthesia Procedure Notes (Signed)
Epidural Patient location during procedure: OB Start time: 12/04/2019 10:22 AM End time: 12/04/2019 10:33 AM  Staffing Anesthesiologist: Lidia Collum, MD Performed: anesthesiologist   Preanesthetic Checklist Completed: patient identified, IV checked, risks and benefits discussed, monitors and equipment checked, pre-op evaluation and timeout performed  Epidural Patient position: sitting Prep: DuraPrep Patient monitoring: heart rate, continuous pulse ox and blood pressure Approach: midline Location: L3-L4 Injection technique: LOR air  Needle:  Needle type: Tuohy  Needle gauge: 17 G Needle length: 9 cm Needle insertion depth: 6 cm Catheter type: closed end flexible Catheter size: 19 Gauge Catheter at skin depth: 11 cm Test dose: negative  Assessment Events: blood not aspirated, injection not painful, no injection resistance, no paresthesia and negative IV test  Additional Notes Reason for block:procedure for pain

## 2019-12-04 NOTE — H&P (Addendum)
OBSTETRIC ADMISSION HISTORY AND PHYSICAL  Susan Rivera is a 23 y.o. female G2P1001 with IUP at [redacted]w[redacted]d presenting for post dates IOL. She reports +FMs. No LOF, VB, blurry vision, headaches, peripheral edema, or RUQ pain. She plans on Breast feeding. She requests BTL for birth control Herbalist).  Dating: By U/S --->  Estimated Date of Delivery: 11/27/19  Sono:  04/20/2019  BPP wNST 12/02/2019 @[redacted]w[redacted]d  cephalic presentation  BPP 8/10 EFW by Le[facility] 6#  No fetal anatomy scan found in chart, however prenatal records in [facility] did not report any fetal anomalies for her morphology ultrasound.  Prenatal History/Complications: Prenatal care started in , transfer in second trimester IOL for low fluid in first pregnancy per patient  Past Medical History: Past Medical History:  Diagnosis Date  . Chlamydia   . Eczema   . Gonorrhea   . Urogenital trichomoniasis     Past Surgical History: Past Surgical History:  Procedure Laterality Date  . NO PAST SURGERIES      Obstetrical History: OB History    Gravida  2   Para  1   Term  1   Preterm      AB      Living  1     SAB      TAB      Ectopic      Multiple      Live Births  1           Social History: Social History   Socioeconomic History  . Marital status: Single    Spouse name: Not on file  . Number of children: Not on file  . Years of education: Not on file  . Highest education level: Not on file  Occupational History  . Not on file  Tobacco Use  . Smoking status: Former Smoker    Quit date: 02/15/2019    Years since quitting: 0.8  . Smokeless tobacco: Never Used  Substance and Sexual Activity  . Alcohol use: Not Currently    Comment: none since + UPT  . Drug use: Never  . Sexual activity: Not Currently  Other Topics Concern  . Not on file  Social History Narrative  . Not on file   Social Determinants of Health   Financial Resource Strain:   . Difficulty of Paying Living  Expenses: Not on file  Food Insecurity:   . Worried About 04/17/2019 in the Last Year: Not on file  . Ran Out of Food in the Last Year: Not on file  Transportation Needs:   . Lack of Transportation (Medical): Not on file  . Lack of Transportation (Non-Medical): Not on file  Physical Activity:   . Days of Exercise per Week: Not on file  . Minutes of Exercise per Session: Not on file  Stress:   . Feeling of Stress : Not on file  Social Connections:   . Frequency of Communication with Friends and Family: Not on file  . Frequency of Social Gatherings with Friends and Family: Not on file  . Attends Religious Services: Not on file  . Active Member of Clubs or Organizations: Not on file  . Attends Programme researcher, broadcasting/film/video Meetings: Not on file  . Marital Status: Not on file    Family History: Family History  Problem Relation Age of Onset  . Diabetes Maternal Uncle   . Diabetes Maternal Grandfather   . Heart disease Maternal Grandfather     Allergies: No Known Allergies  Medications Prior  to Admission  Medication Sig Dispense Refill Last Dose  . docusate sodium (COLACE) 100 MG capsule Take 1 capsule (100 mg total) by mouth 2 (two) times daily as needed. 60 capsule 1   . ferrous gluconate (FERGON) 324 MG tablet Take 1 tablet (324 mg total) by mouth daily with breakfast. 60 tablet 0   . lansoprazole (PREVACID) 15 MG capsule Take 1 tablet by mouth as needed.        Review of Systems   All systems reviewed and negative except as stated in HPI  Blood pressure 124/80, pulse 77, temperature 98.3 F (36.8 C), temperature source Oral, resp. rate 18, height 5\' 1"  (1.549 m), weight 71 kg, last menstrual period 02/06/2019. General appearance: alert, cooperative and appears stated age Lungs: regular rate and effort Heart: regular rate  Abdomen: soft, non-tender Extremities: Homans sign is negative, no sign of DVT Presentation: cephalic Fetal monitoringBaseline: 120 bpm,  Variability: Good {> 6 bpm), Accelerations: Reactive and Decelerations: Absent Uterine activity Minimal on Toco Dilation: 3 Effacement (%): 70 Station: -3 Exam by:: E. Rothermel RN    Prenatal labs: ABO, Rh: --/--/B POS, B POS Performed at [facility], [address], [place], [place] [address]  [phone]) Antibody: NEG (12/18 0735) Rubella:  pending  RPR: Non Reactive (11/17 0936)  HBsAg:    HIV: Non Reactive (11/17 0936)  GBS: Negative/-- (11/17 1214)  2 hr GTT 71/144/110  Prenatal Transfer Tool  Maternal Diabetes: No Genetic Screening: Normal Maternal Ultrasounds/Referrals: Normal Fetal Ultrasounds or other Referrals:  None Maternal Substance Abuse:  No Significant Maternal Medications:  None Significant Maternal Lab Results: None  Results for orders placed or performed during the hospital encounter of 12/04/19 (from the past 24 hour(s))  CBC   Collection Time: 12/04/19  7:35 AM  Result Value Ref Range   WBC 9.5 4.0 - 10.5 K/uL   RBC 4.20 3.87 - 5.11 MIL/uL   Hemoglobin 9.8 (Rivera) 12.0 - 15.0 g/dL   HCT 32.3 (Rivera) 36.0 - 46.0 %   MCV 76.9 (Rivera) 80.0 - 100.0 fL   MCH 23.3 (Rivera) 26.0 - 34.0 pg   MCHC 30.3 30.0 - 36.0 g/dL   RDW 17.6 (H) 11.5 - 15.5 %   Platelets 269 150 - 400 K/uL   nRBC 0.0 0.0 - 0.2 %  Type and screen   Collection Time: 12/04/19  7:35 AM  Result Value Ref Range   ABO/RH(D) B POS    Antibody Screen NEG    Sample Expiration      12/07/2019,2359 Performed at [facility], [address] [place], [place] [address]   ABO/Rh   Collection Time: 12/04/19  7:35 AM  Result Value Ref Range   ABO/RH(D)      B POS Performed at [facility] [address] [place], [place] [address]   Results for orders placed or performed during the hospital encounter of 12/03/19 (from the past 24 hour(s))  [facility] Time: 12/03/19  2:35 PM  Result Value Ref Range   POCT Fern Test Negative = intact amniotic membranes     Patient  Active Problem List   Diagnosis Date Noted  . Post-dates pregnancy 12/04/2019  . Anemia in pregnancy 11/05/2019  . Supervision of other normal pregnancy, antepartum 11/03/2019  . Gonorrhea   . Chlamydia     Assessment: Susan Rivera is a 23 y.o. G2P1001 at [redacted]w[redacted]d here for post date IOL.  1. Labor: Start  with Cytotec.  Consider Pitocin, AROM as appropriate 2. FWB: Cat I 3. Pain: IV meds, epidural as appropriate 4. GBS: negative 5. Birth control: Plans pp BTL.   Plan: Admit Rivera&D Fetal anatomy scan Anticipate Vaginal Delivery, CS as appropriate   Dana Allananya Walsh, MD  12/04/2019, 8:36 AM  GME ATTESTATION:  I saw and evaluated the patient. I agree with the findings and the plan of care as documented in the resident's note, with the exception of the following:  Risks and benefits of induction were reviewed, including failure of method, prolonged labor, need for further intervention, risk of cesarean.  Patient and family seem to understand these risks and wish to proceed. Options of cytotec, AROM, and pitocin reviewed, with use of each discussed.  Susan Rivera, Susan Armand L, DO OB Fellow, Faculty Practice 12/04/2019 10:06 AM

## 2019-12-04 NOTE — Progress Notes (Addendum)
Susan Rivera is a 23 y.o. G2P1001 at [redacted]w[redacted]d admitted for induction of labor due to Post dates. Due date 11/27/19.  Subjective: Comfortable with epidural in place  Objective: BP 99/70   Pulse 85   Temp 97.7 F (36.5 C) (Axillary)   Resp 18   Ht 5\' 1"  (1.549 m)   Wt 71 kg   LMP 02/06/2019   SpO2 100%   BMI 29.58 kg/m  No intake/output data recorded.  FHT:  FHR: 120 bpm, variability: moderate,  accelerations:  Present,  decelerations:  Present  variable that return to baseline UC:   Moderate on Toco  SVE:   Dilation: 6 Effacement (%): 70 Station: -1 Exam by:: E. Rothermel RN   Labs: Lab Results  Component Value Date   WBC 9.5 12/04/2019   HGB 9.8 (L) 12/04/2019   HCT 32.3 (L) 12/04/2019   MCV 76.9 (L) 12/04/2019   PLT 269 12/04/2019    Assessment / Plan: Susan Rivera is a 23 y.o. G2P1001 at [redacted]w[redacted]d here for post date IOL.  Labor: s/p Cytotec x 1, SROM at 0930 for clear fluid. Consider Pitocin at next check.  Fetal Wellbeing:  Category II, variable decelerations reassuring that return to baseline Pain Control:  Epidural I/D:  GBS negative Anticipated MOD:  Vaginal Delivery, CS as appropriate  Carollee Leitz MD PGY1 [facility] 12/04/2019, 11:43 AM

## 2019-12-04 NOTE — Progress Notes (Signed)
Susan Rivera is a 23 y.o. G2P1001 at [redacted]w[redacted]d admitted for induction of labor due to Post dates. Due date 11/27/2019.  Subjective: Feeling some lower back pressure.  Otherwise comfortable with epidural in place  Objective: BP 121/75   Pulse 96   Temp 97.7 F (36.5 C) (Axillary)   Resp 18   Ht 5\' 1"  (1.549 m)   Wt 71 kg   LMP 02/06/2019   SpO2 100%   BMI 29.58 kg/m  No intake/output data recorded.  FHT:  FHR:120-130 bpm, variability: moderate,  accelerations:  Present,  decelerations:  Present variable and returns to baseline UC:   Moderate on Toco  SVE:   Dilation: 8.5 Effacement (%): 90 Station: -1 Exam by:: Dr. Volanda Napoleon    Labs: Lab Results  Component Value Date   WBC 9.5 12/04/2019   HGB 9.8 (L) 12/04/2019   HCT 32.3 (L) 12/04/2019   MCV 76.9 (L) 12/04/2019   PLT 269 12/04/2019    Assessment / Plan: Susan Rivera a 23 y.o.G2P1001 at [redacted]w[redacted]d here for post date IOL  Labor: s/p Cytotec x1, SROM.  Making cervical change. Continue expectant management.  Consider Pitocin as appropriate Fetal Wellbeing:  Category II Pain Control:  Epidural I/D:  GBS negative Anticipated MOD:  Vaginal Delivery, CS as appropriate  Carollee Leitz MD PGY1 [facility] 12/04/2019, 4:32 PM

## 2019-12-04 NOTE — Progress Notes (Signed)
Susan Rivera is a 23 y.o. G2P1001 at [redacted]w[redacted]d admitted for induction of labor due to Post dates. Due date 11/27/2019.  Subjective: Comfortable with epidural in place.  Objective: BP 114/75   Pulse 90   Temp 97.7 F (36.5 C) (Axillary)   Resp 18   Ht 5\' 1"  (1.549 m)   Wt 71 kg   LMP 02/06/2019   SpO2 100%   BMI 29.58 kg/m  No intake/output data recorded.  FHT:  FHR: 115-120 bpm, variability: moderate,  accelerations:  Present,  decelerations:  Present variables that return to baseline UC:   Moderate on Toco  SVE:   Dilation: 9 Effacement (%): 90 Station: -1 Exam by:: Dr. Volanda Napoleon    Labs: Lab Results  Component Value Date   WBC 9.5 12/04/2019   HGB 9.8 (L) 12/04/2019   HCT 32.3 (L) 12/04/2019   MCV 76.9 (L) 12/04/2019   PLT 269 12/04/2019    Assessment / Plan: Susan Rivera a 23 y.o.G2P1001 at [redacted]w[redacted]d here for post date IOL  Labor: s/p Cytotec x1, SROM.  Not much cervical change, start Pitocin. Fetal Wellbeing:  Category I Pain Control:  Epidural I/D:  GBS negative Anticipated MOD:  Vaginal Delivery, CS as appropriate  Carollee Leitz MD PGY1 [facility] 12/04/2019, 6:22 PM

## 2019-12-04 NOTE — Anesthesia Preprocedure Evaluation (Signed)
Anesthesia Evaluation  Patient identified by MRN, date of birth, ID band Patient awake    Reviewed: Allergy & Precautions, H&P , NPO status , Patient's Chart, lab work & pertinent test results  History of Anesthesia Complications Negative for: history of anesthetic complications  Airway Mallampati: II  TM Distance: >3 FB Neck ROM: full    Dental no notable dental hx.    Pulmonary neg pulmonary ROS, former smoker,    Pulmonary exam normal        Cardiovascular negative cardio ROS Normal cardiovascular exam Rhythm:regular Rate:Normal     Neuro/Psych negative neurological ROS  negative psych ROS   GI/Hepatic negative GI ROS, Neg liver ROS,   Endo/Other  negative endocrine ROS  Renal/GU negative Renal ROS  negative genitourinary   Musculoskeletal   Abdominal   Peds  Hematology  (+) Blood dyscrasia, anemia ,   Anesthesia Other Findings   Reproductive/Obstetrics (+) Pregnancy                             Anesthesia Physical Anesthesia Plan  ASA: II  Anesthesia Plan: Epidural   Post-op Pain Management:    Induction:   PONV Risk Score and Plan:   Airway Management Planned:   Additional Equipment:   Intra-op Plan:   Post-operative Plan:   Informed Consent: I have reviewed the patients History and Physical, chart, labs and discussed the procedure including the risks, benefits and alternatives for the proposed anesthesia with the patient or authorized representative who has indicated his/her understanding and acceptance.       Plan Discussed with:   Anesthesia Plan Comments:         Anesthesia Quick Evaluation  

## 2019-12-04 NOTE — Discharge Summary (Signed)
Postpartum Discharge Summary     Patient Name: Susan Rivera DOB: October 02, 1996 MRN: [id number]  Date of admission: 12/04/2019 Delivering Provider: Sharene Butters D   Date of discharge: 12/06/2019  Admitting diagnosis: Post-dates pregnancy [O48.0] Intrauterine pregnancy: [redacted]w[redacted]d    Secondary diagnosis:  Active Problems:   Anemia in pregnancy   Post-dates pregnancy   SVD (spontaneous vaginal delivery)  Additional problems: none     Discharge diagnosis: Term Pregnancy Delivered                                                                                                Post partum procedures:none  Augmentation: Pitocin and Cytotec  Complications: None  Hospital course:  Induction of Labor With Vaginal Delivery   23y.o. yo G2P1001 at 463w0das admitted to the hospital 12/04/2019 for induction of labor.  Indication for induction: Postdates.  Patient had an uncomplicated labor course as follows: Membrane Rupture Time/Date: 9:40 AM ,12/04/2019   Intrapartum Procedures: Episiotomy: None [1]                                         Lacerations:  None [1]  Patient had delivery of a Viable infant.  Information for the patient's newborn:  EaLaquanda, Bick0[[id number]]Delivery Method: Vaginal, Spontaneous(Filed from Delivery Summary)    12/04/2019  Details of delivery can be found in separate delivery note.  Patient had a routine postpartum course. Patient is discharged home 12/06/19. Delivery time: 9:00 PM    Magnesium Sulfate received: No BMZ received: No Rhophylac:N/A MMR:N/A Transfusion:No  Physical exam  Vitals:   12/05/19 1145 12/05/19 1430 12/05/19 2150 12/06/19 0555  BP: 111/80 115/82 116/74 110/75  Pulse: 82 80 84 87  Resp: _0 Temp: 98 F (36.7 C) 98 F (36.7 C) 98.9 F (37.2 C) 98 F (36.7 C)  TempSrc: Oral Oral Oral Oral  SpO2: 99% 99%  100%  Weight:      Height:       General: alert, cooperative and no distress Lochia: appropriate Uterine  Fundus: firm Incision: N/A DVT Evaluation: No evidence of DVT seen on physical exam. Negative Homan's sign. No cords or calf tenderness. No significant calf/ankle edema. Labs: Lab Results  Component Value Date   WBC 14.6 (H) 12/05/2019   HGB 8.3 (L) 12/05/2019   HCT 27.1 (L) 12/05/2019   MCV 76.1 (L) 12/05/2019   PLT 238 12/05/2019   No flowsheet data found.  Discharge instruction: per After Visit Summary and "Baby and Me Booklet".  After visit meds:  Allergies as of 12/06/2019   No Known Allergies     Medication List    TAKE these medications   acetaminophen 325 MG tablet Commonly known as: Tylenol Take 2 tablets (650 mg total) by mouth every 4 (four) hours as needed (for pain scale < 4).   docusate sodium 100 MG capsule Commonly known as: COLACE Take 1 capsule (100 mg total) by mouth 2 (two) times daily  as needed.   ferrous gluconate 324 MG tablet Commonly known as: FERGON Take 1 tablet (324 mg total) by mouth daily with breakfast.   ibuprofen 600 MG tablet Commonly known as: ADVIL Take 1 tablet (600 mg total) by mouth every 6 (six) hours.   Prevacid 15 MG capsule Generic drug: lansoprazole Take 1 tablet by mouth as needed.       Diet: routine diet  Activity: Advance as tolerated. Pelvic rest for 6 weeks.   Outpatient follow up:4 weeks Follow up Appt:No future appointments. Follow up Visit: [place] for [facility]. Schedule an appointment as soon as possible for a visit in 2 week(s).   Specialty: Obstetrics and Gynecology Why: Make appointment to be seen in 2 weeks to scheduled tubal ligation then appointment in 4 weeks for postpartum care Contact information: [address] [place], [place] [id number] [place] [phone] [place]. Call.   Specialty: Obstetrics and Gynecology Why: Call tomorrow morning to scheduled  outpatient circumcision  Contact information: [address], [place] [phone]          Please schedule this patient for Postpartum visit in: 4 weeks with the following provider: Any provider For C/S patients schedule nurse incision check in weeks 2 weeks: no Low risk pregnancy complicated by: nothing Delivery mode:  SVD Anticipated Birth Control:  Plans Interval BTL PP Procedures needed: needs Pap at [facility] appointment  Schedule Integrated [place] visit: no   Newborn Data: Live born female  Birth Weight: 3291g APGAR: 8, 9  Newborn Delivery   Birth date/time: 12/04/2019 21:00:00 Delivery type: Vaginal, Spontaneous      Baby Feeding: Breast Disposition:home with mother   12/06/2019 Lajean Manes, CNM

## 2019-12-05 ENCOUNTER — Encounter (HOSPITAL_COMMUNITY)
Admission: AD | Disposition: A | Discharge status: Home / Self Care | Payer: Self-pay | Source: Home / Self Care | Attending: Obstetrics & Gynecology

## 2019-12-05 LAB — CBC
HCT: 27.1 % — ABNORMAL LOW (ref 36.0–46.0)
Hemoglobin: 8.3 g/dL — ABNORMAL LOW (ref 12.0–15.0)
MCH: 23.3 pg — ABNORMAL LOW (ref 26.0–34.0)
MCHC: 30.6 g/dL (ref 30.0–36.0)
MCV: 76.1 fL — ABNORMAL LOW (ref 80.0–100.0)
Platelets: 238 10*3/uL (ref 150–400)
RBC: 3.56 MIL/uL — ABNORMAL LOW (ref 3.87–5.11)
RDW: 17.7 % — ABNORMAL HIGH (ref 11.5–15.5)
WBC: 14.6 10*3/uL — ABNORMAL HIGH (ref 4.0–10.5)
nRBC: 0 % (ref 0.0–0.2)

## 2019-12-05 LAB — ABO/RH: ABO/RH(D): B POS

## 2019-12-05 SURGERY — LIGATION, FALLOPIAN TUBE, POSTPARTUM
Anesthesia: Choice | Laterality: Bilateral

## 2019-12-05 MED ORDER — FAMOTIDINE 20 MG PO TABS
40.0000 mg | ORAL_TABLET | Freq: Once | ORAL | Status: DC
  Filled 2019-12-05: qty 2

## 2019-12-05 MED ORDER — LACTATED RINGERS IV SOLN: INTRAVENOUS | Status: DC

## 2019-12-05 MED ORDER — OXYCODONE HCL 5 MG PO TABS
5.0000 mg | ORAL_TABLET | Freq: Four times a day (QID) | ORAL | Status: DC | PRN
  Administered 2019-12-05 – 2019-12-06 (×4): 5 mg via ORAL
  Filled 2019-12-05 (×4): qty 1

## 2019-12-05 MED ORDER — METOCLOPRAMIDE HCL 10 MG PO TABS
10.0000 mg | ORAL_TABLET | Freq: Once | ORAL | Status: DC
  Filled 2019-12-05: qty 1

## 2019-12-05 NOTE — Progress Notes (Signed)
CSW received consult due to score 7 on Edinburgh Depression Screen with answering 1 to number 10.   CSW went to speak with MOB at bedside. Upon entering the room, CSW congratulated MOB on the birth of infant. CSW advised MOB of the HIPPA policy and was advised that it was fine for her mother to remain in the room while CSW spoke with her. CSW understanding and advised MOB of CSW's role and the reason for CSW coming to speak with her. Per MOB, "my hormones have just been all over the place and my pregnancy has been hard". CSW understanding and advised MOB that CSW just wanted to be sure that MOB was not feeling bad mentally. MOB reported that she has been fine and denies feelings SI to HI at this time. CSW offered MOB therapy resources as MOB reported that she has never been in therapy. MOB very receptive and thankful for resources provided.   MOB reported that she has all needed items to care for infant but does report a desire to have more pampers for infant. CSW updated RN of this. MOB expressed that her primary support is her mom, Susan Rivera. CSW provided MOB PPD and SIDS education. CSW also provided education regarding Baby Blues vs PMADs and provided MOB with resources for mental health follow up.  CSW encouraged MOB to evaluate her mental health throughout the postpartum period with the use of the New Mom Checklist developed by Postpartum Progress as well as the [place] Postnatal Depression Scale and notify a medical professional if symptoms arise.      Susan Rivera, MSW, LCSW [facility] [place] [facility] [place] [phone]

## 2019-12-05 NOTE — Lactation Note (Signed)
This note was copied from a baby's chart. Lactation Consultation Note  Patient Name: Susan Rivera OEUMP'N Date: 12/05/2019 Reason for consult: Follow-up assessment;Term;Infant weight loss;Other (Comment);Difficult latch(semi - compressible areolas- requiring shells / hand pump and coconut oil to soften tissue)  Baby is 18 hours old  Per mom the baby last attempted to feed at 1:13p and he was sleepy.  Presently lying next to mom fully dressed / blanket/ and hat.  LC reviewed the benefits of STS feedings until the baby is back to birth weight/ gaining steadily/ and can stay awake for majority of feeding/ and back to birth weight. LC offered to assist and check the baby's diaper / baby had medium loose stool ( mec).  LC placed baby STS and 1st attempted to assist with cross cradle and the  Baby was on and off. Difficult latch.  LC able to hand express several drops off the left breast / football / and baby latched easily with increased swallows with breast compressions/ mom able to do the compressions. Baby still feeding at 10 mins.  LC discussed nutritive vs non - nutritive feeding patterns and the importance of watching for hanging out latched.  LC instructed mom on the use if breast shells between feedings except when  Sleeping or 10  mins prior to feed.  Prior to latch due to the semi compressible areolas - breast massage,hand express, pre-pump with hand pump to make the nipple - areola complex more elastic for a deeper latch and reverse pressure.  ( LC reviewed this [place] plan steps with mom )  LC also recommended a dab of coconut oil to help with reverse pressure due to  Dryness.  Latch with firm support.  LC reassured mom latching will get easier with these steps and pre - work.     Maternal Data Has patient been taught Hand Expression?: Yes(seversal drops and mom able to return demo)  Feeding Feeding Type: Breast Fed  LATCH Score Latch: Grasps breast easily, tongue down, lips  flanged, rhythmical sucking.  Audible Swallowing: Spontaneous and intermittent  Type of Nipple: Everted at rest and after stimulation  Comfort (Breast/Nipple): Soft / non-tender  Hold (Positioning): Assistance needed to correctly position infant at breast and maintain latch.  LATCH Score: 9  Interventions Interventions: Breast feeding basics reviewed;Assisted with latch;Skin to skin;Breast massage;Hand express;Reverse pressure;Breast compression;Adjust position;Support pillows;Position options;Shells;Hand pump  Lactation Tools Discussed/Used Tools: Shells;Pump;Flanges Flange Size: 24;27(#24 F comforfable per mom / #27 F provided if needed) Shell Type: Inverted Breast pump type: Manual Pump Review: Milk Storage;Setup, frequency, and cleaning Initiated by:: MAI Date initiated:: 12/05/19   Consult Status Consult Status: Follow-up Date: 12/06/19 Follow-up type: In-patient    [place] 12/05/2019, 3:28 PM

## 2019-12-05 NOTE — Progress Notes (Signed)
POSTPARTUM PROGRESS NOTE  Post Partum Day 1  Subjective:  Susan Rivera is a 23 y.o. S2A7681 s/p SVD at [redacted]w[redacted]d.  No acute events overnight.  Pt denies problems with ambulating, voiding or po intake.  She denies nausea or vomiting.  Pain is moderately controlled. Plans BTL this morning- has been NPO since MN.  She has had flatus. She has not had bowel movement.  Lochia Small.   Objective: Blood pressure 112/84, pulse 85, temperature 98.3 F (36.8 C), temperature source Oral, resp. rate 16, height 5\' 1"  (1.549 m), weight 71 kg, last menstrual period 02/06/2019, SpO2 100 %, unknown if currently breastfeeding.  Physical Exam:  General: alert, cooperative and no distress Abdomen: soft, nontender,  Uterine Fundus: firm, appropriately tender DVT Evaluation: No calf swelling or tenderness Extremities: no edema  Recent Labs    12/04/19 0735 12/05/19 0521  HGB 9.8* 8.3*  HCT 32.3* 27.1*    Assessment/Plan: Susan Rivera is a 23 y.o. [id number] s/p SVD at [redacted]w[redacted]d   PPD#1 - Doing well Contraception: Plans BTL  Feeding: Breat  Dispo: Plan for discharge tomorrow.   LOS: 1 day   Lajean Manes, CNM 12/05/2019, 8:04 AM

## 2019-12-05 NOTE — Progress Notes (Signed)
OB Note  Patient just approved for Medicaid yesterday 12/18 (see media tab) so she has it as secondary which she wasn't aware of. Pt hasn't signed BTL forms. D/w her that unable to do btl today.   Durene Romans MD Attending Center for [employer] (Faculty Practice) 12/05/2019 Time: [phone]

## 2019-12-05 NOTE — Lactation Note (Signed)
This note was copied from a baby's chart. Lactation Consultation Note Baby 7 hrs old. Sleeping STS in football position. Mom stated baby will suckle on his fingers some but isn't wanting to BF right now. BF well after delivery. Noted abd. Slightly distended. Baby burped up juicy sounding, then swallowed.  Mom has compressible short shaft nipples, everts well w/ hand expression. Bare glistening w/hand expression. Taught mom how to hand express. Newborn behavior, STS, I&O, breast massage, supply and demand discussed. Mom has a 2 yr old that she stated she didn't BF d/t couldn't latch. Mom stated this baby will latch good when he wants to. Mom is having BTL today at noon. Mom encouraged to feed baby 8-12 times/24 hours and with feeding cues. Encouraged if baby hasn't cued in 3 hrs to stimulate baby to feed. Encouraged to call for assistance or questions. Lactation brochure given.  Mom recently moved from [place]. Wants to sign up for [facility]. LC will fax information to [facility].   Patient Name: Susan Rivera Date: 12/05/2019 Reason for consult: Initial assessment;1st time breastfeeding;Term   Maternal Data Has patient been taught Hand Expression?: Yes Does the patient have breastfeeding experience prior to this delivery?: No  Feeding    LATCH Score Latch: Too sleepy or reluctant, no latch achieved, no sucking elicited.  Audible Swallowing: None  Type of Nipple: Everted at rest and after stimulation  Comfort (Breast/Nipple): Soft / non-tender  Hold (Positioning): Full assist, staff holds infant at breast  LATCH Score: 4  Interventions Interventions: Breast feeding basics reviewed;Support pillows;Assisted with latch;Position options;Skin to skin;Breast massage;Hand express;Adjust position;Breast compression  Lactation Tools Discussed/Used WIC Program: No(wants to sign up [place])   Consult Status Consult Status: Follow-up Date: 12/05/19(in pm) Follow-up type:  In-patient    Theodoro Kalata 12/05/2019, 4:13 AM

## 2019-12-05 NOTE — Anesthesia Postprocedure Evaluation (Signed)
Anesthesia Post Note  Patient: Susan Rivera  Procedure(s) Performed: AN AD [place]     Patient location during evaluation: Mother Baby Anesthesia Type: Epidural Level of consciousness: awake and alert Pain management: pain level controlled Vital Signs Assessment: post-procedure vital signs reviewed and stable Respiratory status: spontaneous breathing, nonlabored ventilation and respiratory function stable Cardiovascular status: stable Postop Assessment: no headache, no backache and epidural receding Anesthetic complications: no    Last Vitals:  Vitals:   12/05/19 0340 12/05/19 0729  BP: 126/65 112/84  Pulse: 83 85  Resp: 18 16  Temp: 37 C 36.8 C  SpO2: 100% 100%    Last Pain:  Vitals:   12/05/19 0730  TempSrc:   PainSc: 5    Pain Goal:                   Drucie Opitz

## 2019-12-06 MED ORDER — IBUPROFEN 600 MG PO TABS: 600.0000 mg | ORAL_TABLET | Freq: Four times a day (QID) | ORAL | 0 refills | Status: AC

## 2019-12-06 MED ORDER — FERROUS SULFATE 325 (65 FE) MG PO TABS
325.0000 mg | ORAL_TABLET | Freq: Every day | ORAL | Status: DC
  Administered 2019-12-06: 325 mg via ORAL
  Filled 2019-12-06: qty 1

## 2019-12-06 MED ORDER — ACETAMINOPHEN 325 MG PO TABS: 650.0000 mg | ORAL_TABLET | ORAL | 0 refills | Status: AC | PRN

## 2019-12-06 NOTE — Lactation Note (Signed)
This note was copied from a baby's chart. Lactation Consultation Note  Patient Name: Susan Rivera [id number] Date: 12/06/2019 Reason for consult: Follow-up assessment;Other (Comment);Infant weight loss(6 % weight loss - serum BIli being drawn) Baby is 37 hours / Bili - check at 0530 32 hours - 7.6.  As LC entered the room per mom the baby just fed on the right breast / football for 30 mins with increased swallows. Breast are feeling different than yesterday and heavier. The pre - pumping is helping to latch deeper per mom.  LC reviewed the breast feeding goals for 24 hours 8-12 times in 24 hours, sore nipple and engorgement prevention and tx, nutritive vs non - nutritive feeding patterns and the importance of watching the baby for hanging out latched.  Importance of STS feedings until the baby is back to birth weight, gaining steadily, and can stay awake for majority of feeding.  [place] reminded mom due to her areola edema the shells between feedings except when sleeping would enhance the compressibility of the areola for a consist depth latch.  Mom has a hand pump with #27 F if needed and shells.  Per mom the coconut oil is helping with the nipple dryness and softening the areolas. Mom has comfort gels given by the [facility].  Mom aware of the [facility] resources and has the Mother and baby Care booklet.  [place] praised mom for how well she is doing with breastfeeding.    Maternal Data    Feeding Feeding Type: (per mom jsut fed 30 mins)  LATCH Score  - Latch score by the [facility] )  Latch: Grasps breast easily, tongue down, lips flanged, rhythmical sucking.  Audible Swallowing: Spontaneous and intermittent  Type of Nipple: Everted at rest and after stimulation  Comfort (Breast/Nipple): Soft / non-tender  Hold (Positioning): No assistance needed to correctly position infant at breast.  LATCH Score: 10  Interventions Interventions: Breast feeding basics reviewed  Lactation Tools  Discussed/Used Tools: Pump;Shells Flange Size: 24;27 Shell Type: Inverted Breast pump type: Manual Pump Review: Setup, frequency, and cleaning;Milk Storage   Consult Status Consult Status: Complete Date: 12/06/19    Susan Rivera 12/06/2019, 10:25 AM

## 2019-12-06 NOTE — Discharge Instructions (Signed)

## 2019-12-22 ENCOUNTER — Telehealth: Payer: Self-pay | Admitting: Obstetrics & Gynecology

## 2019-12-22 NOTE — Telephone Encounter (Signed)
Attempted to call patient about her appointment being rescheduled for her interval tubal. Was not able to leave a message.

## 2019-12-28 ENCOUNTER — Ambulatory Visit: Payer: BC Managed Care – PPO | Admitting: Women's Health

## 2019-12-30 ENCOUNTER — Encounter: Payer: Self-pay | Admitting: Family Medicine

## 2019-12-30 ENCOUNTER — Ambulatory Visit: Payer: BC Managed Care – PPO | Admitting: Obstetrics and Gynecology

## 2020-02-26 IMAGING — US US FETAL BPP W/ NON-STRESS
1 series · 10 of 10 positions shown · non-contrast
Comparison: none

[Series 1: us fetal bpp w/nonstress · 10 acquisitions, 10 frames shown]
[im 1/10]
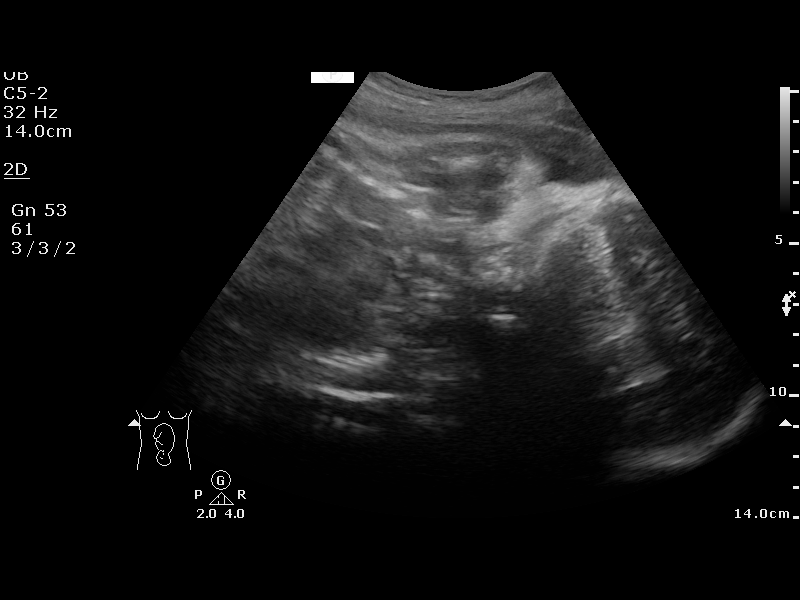
[im 2/10]
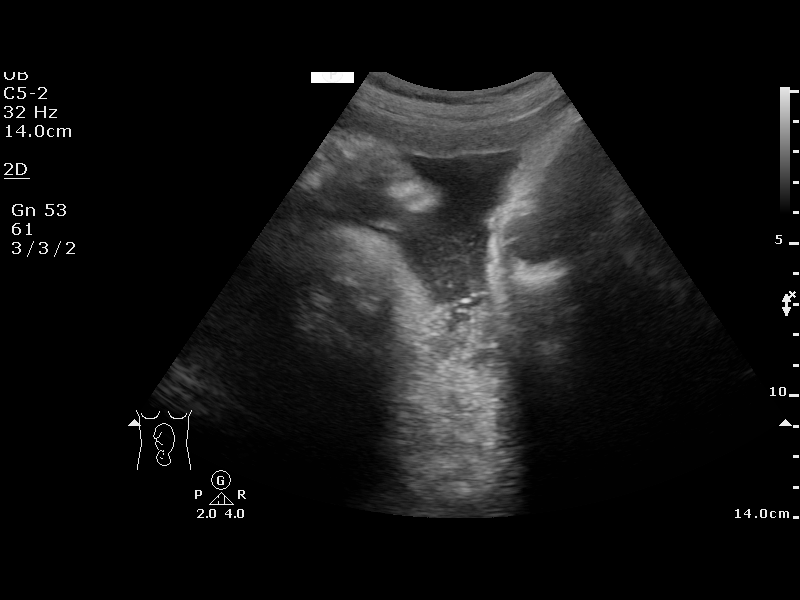
[im 3/10]
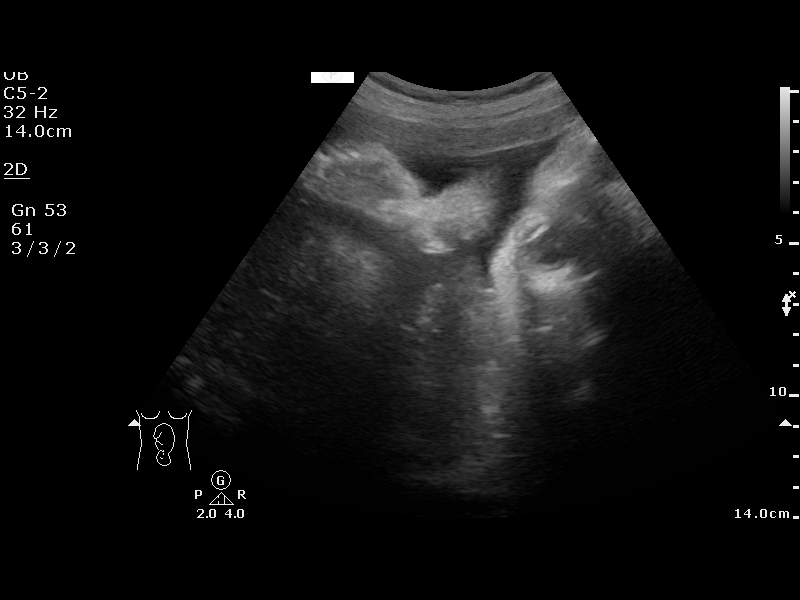
[im 4/10]
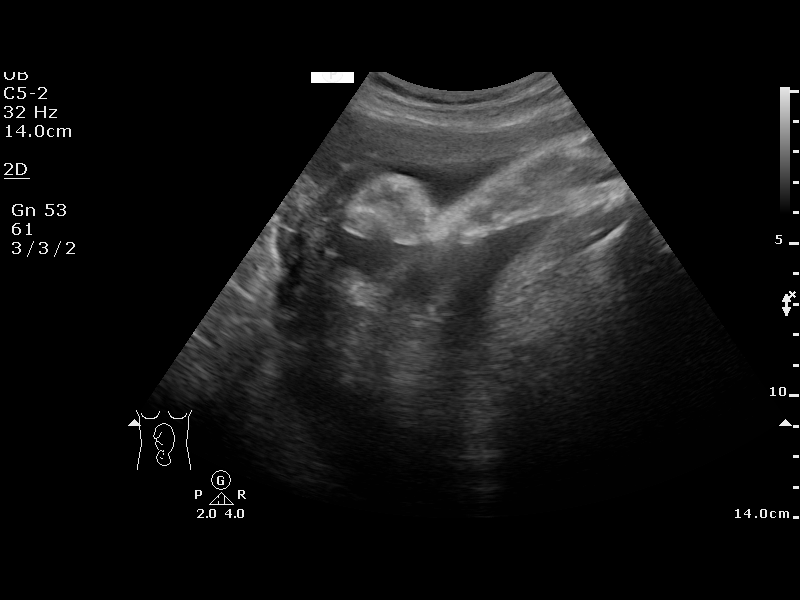
[im 5/10]
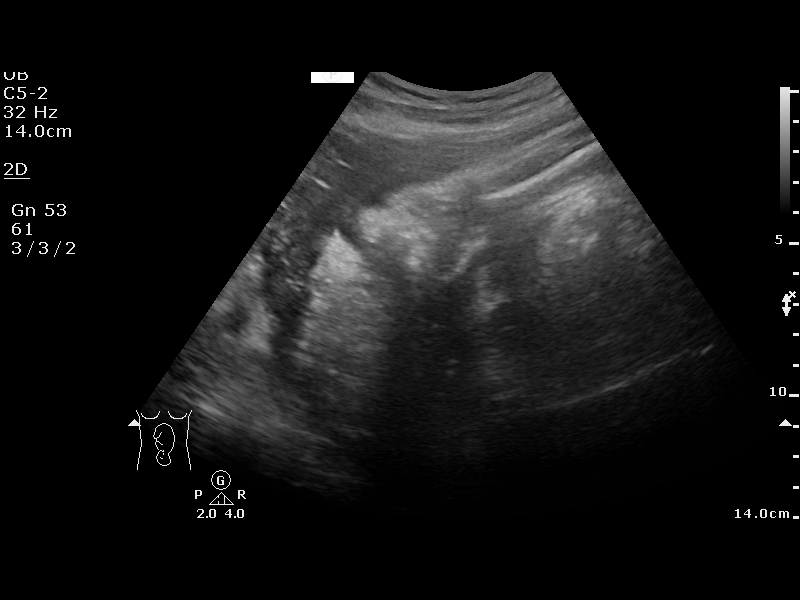
[im 6/10]
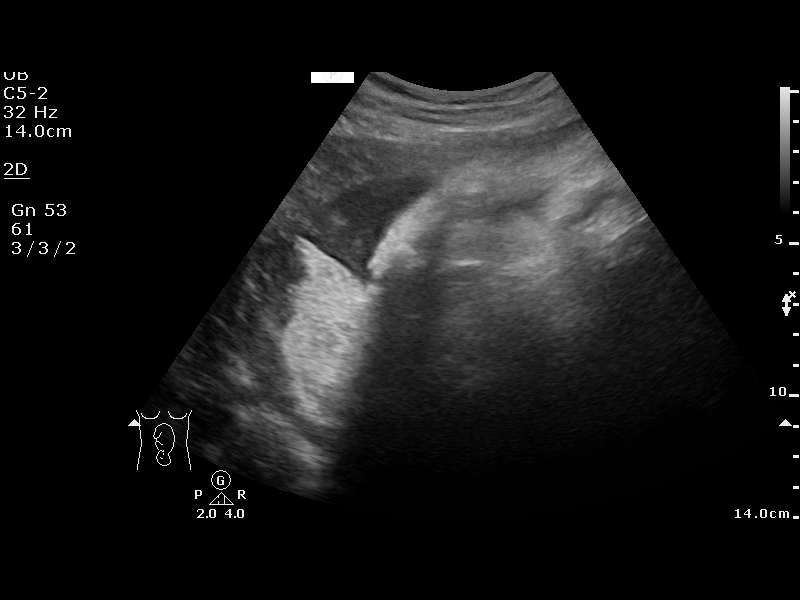
[im 7/10]
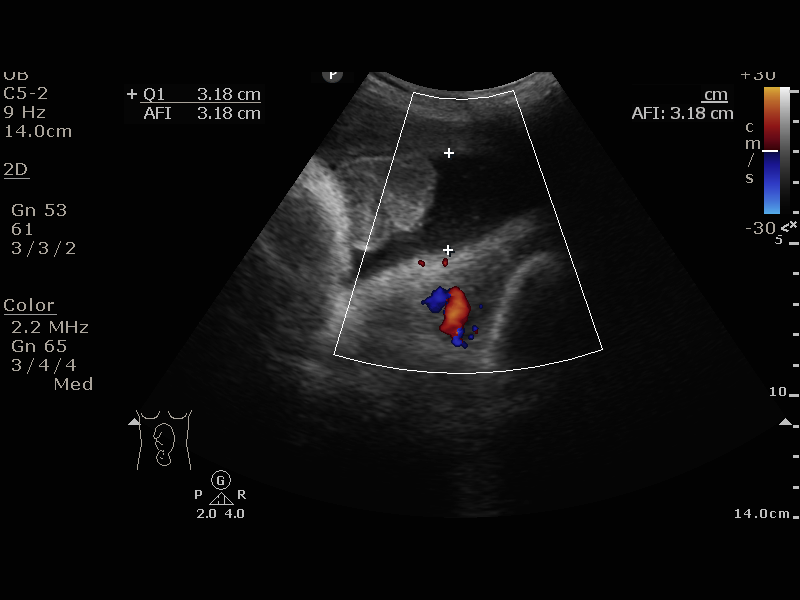
[im 8/10]
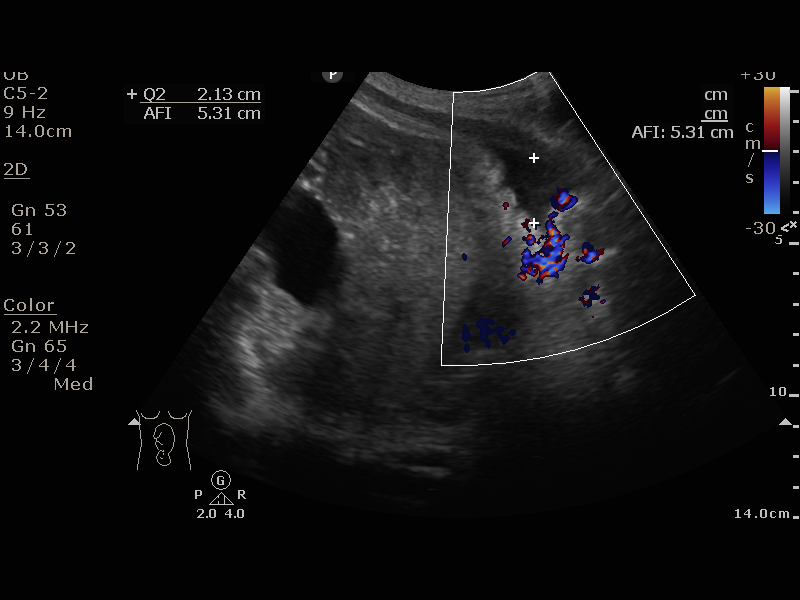
[im 9/10]
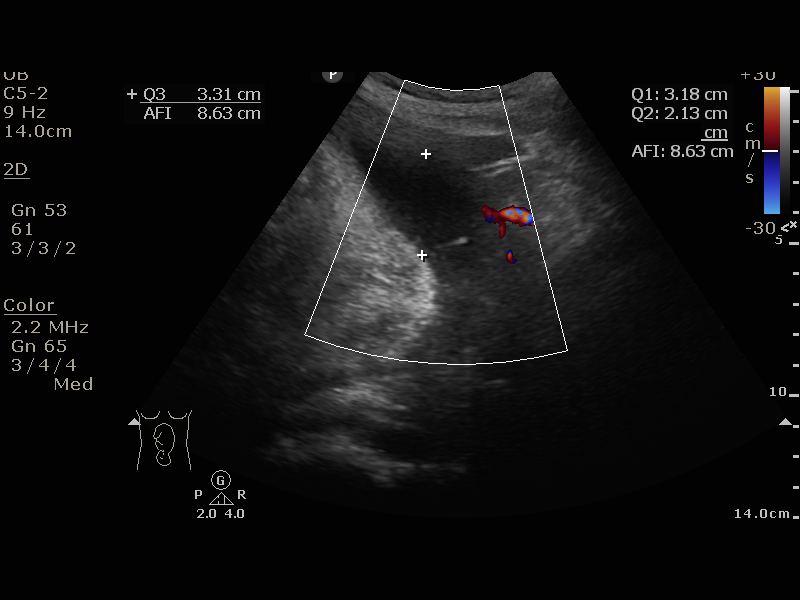
[im 10/10]
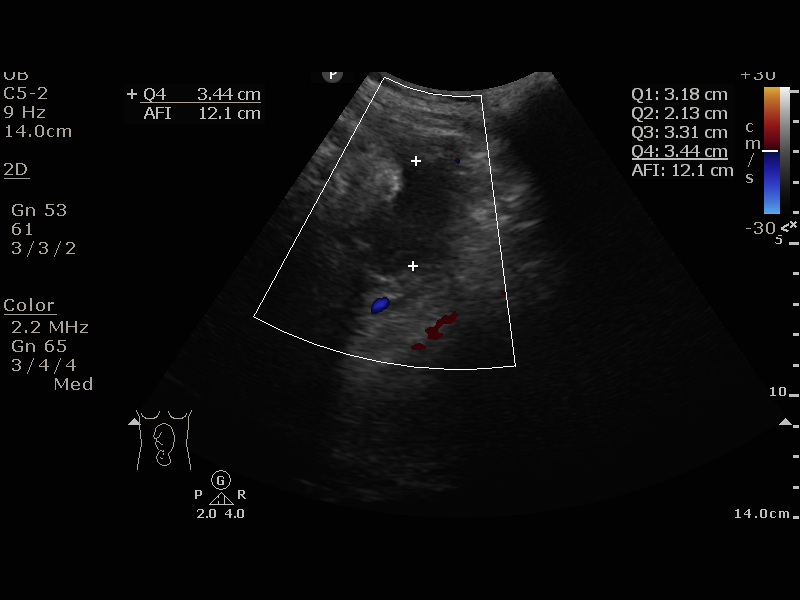

[10 of 10 positions shown; findings below may reference images not displayed]

[address]
                                                            Women's
                                                            [REDACTED]

  1  US FETAL BPP W/NONSTRESS             76818.4      NARANCICA NAHOD
 ----------------------------------------------------------------------

 ----------------------------------------------------------------------
Service(s) Provided

 ----------------------------------------------------------------------
Indications

  Weeks of gestation of pregnancy not
  specified
  Postdate pregnancy (40-42 weeks)
 ----------------------------------------------------------------------
Fetal Evaluation

 Num Of Fetuses:         1
 Preg. Location:         Intrauterine
 Cardiac Activity:       Observed
 Presentation:           Cephalic

 Amniotic Fluid
 AFI FV:      Within normal limits

 AFI Sum(cm)                 Largest Pocket(cm)


 RUQ(cm)       RLQ(cm)       LUQ(cm)        LLQ(cm)

Biophysical Evaluation

 Amniotic F.V:   Pocket => 2 cm             F. Tone:        Observed
 F. Movement:    Observed                   N.S.T:          Reactive
 F. Breathing:   Not Observed               Score:          [DATE]
Impression

 Viable fetus in cephalic presentation
Recommendations

 Patient scheduled for postdate induction
                Aujla, Danii
